# Patient Record
Sex: Female | Born: 1985 | Race: Black or African American | Hispanic: No | Marital: Single | Smoking: Current every day smoker
Health system: Southern US, Community
[De-identification: ages and names within clinical notes are randomized; demographics above are authoritative.]

## PROBLEM LIST (undated history)

## (undated) DIAGNOSIS — J45909 Unspecified asthma, uncomplicated: Secondary | ICD-10-CM

## (undated) DIAGNOSIS — F988 Other specified behavioral and emotional disorders with onset usually occurring in childhood and adolescence: Secondary | ICD-10-CM

## (undated) DIAGNOSIS — F4310 Post-traumatic stress disorder, unspecified: Secondary | ICD-10-CM

## (undated) DIAGNOSIS — F419 Anxiety disorder, unspecified: Secondary | ICD-10-CM

## (undated) DIAGNOSIS — F329 Major depressive disorder, single episode, unspecified: Secondary | ICD-10-CM

## (undated) DIAGNOSIS — D573 Sickle-cell trait: Secondary | ICD-10-CM

## (undated) DIAGNOSIS — F909 Attention-deficit hyperactivity disorder, unspecified type: Secondary | ICD-10-CM

## (undated) HISTORY — PX: APPENDECTOMY: SHX54

---

## 2016-02-02 ENCOUNTER — Emergency Department (HOSPITAL_COMMUNITY)
Admission: EM | Admit: 2016-02-02 | Discharge: 2016-02-02 | Disposition: A | Discharge status: Home / Self Care | Payer: Medicaid Other | Attending: Emergency Medicine | Admitting: Emergency Medicine

## 2016-02-02 ENCOUNTER — Emergency Department (HOSPITAL_COMMUNITY): Payer: Medicaid Other

## 2016-02-02 ENCOUNTER — Encounter (HOSPITAL_COMMUNITY): Payer: Self-pay | Admitting: Emergency Medicine

## 2016-02-02 DIAGNOSIS — J45909 Unspecified asthma, uncomplicated: Secondary | ICD-10-CM | POA: Diagnosis not present

## 2016-02-02 DIAGNOSIS — Z8659 Personal history of other mental and behavioral disorders: Secondary | ICD-10-CM | POA: Diagnosis not present

## 2016-02-02 DIAGNOSIS — F17200 Nicotine dependence, unspecified, uncomplicated: Secondary | ICD-10-CM | POA: Insufficient documentation

## 2016-02-02 DIAGNOSIS — Z862 Personal history of diseases of the blood and blood-forming organs and certain disorders involving the immune mechanism: Secondary | ICD-10-CM | POA: Diagnosis not present

## 2016-02-02 DIAGNOSIS — R05 Cough: Secondary | ICD-10-CM | POA: Diagnosis present

## 2016-02-02 DIAGNOSIS — J069 Acute upper respiratory infection, unspecified: Secondary | ICD-10-CM | POA: Diagnosis not present

## 2016-02-02 HISTORY — DX: Anxiety disorder, unspecified: F41.9

## 2016-02-02 HISTORY — DX: Major depressive disorder, single episode, unspecified: F32.9

## 2016-02-02 HISTORY — DX: Attention-deficit hyperactivity disorder, unspecified type: F90.9

## 2016-02-02 HISTORY — DX: Unspecified asthma, uncomplicated: J45.909

## 2016-02-02 HISTORY — DX: Post-traumatic stress disorder, unspecified: F43.10

## 2016-02-02 HISTORY — DX: Other specified behavioral and emotional disorders with onset usually occurring in childhood and adolescence: F98.8

## 2016-02-02 HISTORY — DX: Sickle-cell trait: D57.3

## 2016-02-02 LAB — RAPID STREP SCREEN (MED CTR MEBANE ONLY): STREPTOCOCCUS, GROUP A SCREEN (DIRECT): NEGATIVE

## 2016-02-02 MED ORDER — BENZONATATE 100 MG PO CAPS
100.0000 mg | ORAL_CAPSULE | Freq: Once | ORAL | Status: AC
  Administered 2016-02-02: 100 mg via ORAL
  Filled 2016-02-02: qty 1

## 2016-02-02 MED ORDER — IBUPROFEN 600 MG PO TABS: 600.0000 mg | ORAL_TABLET | Freq: Four times a day (QID) | ORAL | Status: DC | PRN

## 2016-02-02 MED ORDER — IBUPROFEN 200 MG PO TABS
600.0000 mg | ORAL_TABLET | Freq: Once | ORAL | Status: AC
  Administered 2016-02-02: 600 mg via ORAL
  Filled 2016-02-02: qty 3

## 2016-02-02 MED ORDER — BENZONATATE 100 MG PO CAPS: 100.0000 mg | ORAL_CAPSULE | Freq: Three times a day (TID) | ORAL | Status: DC | PRN

## 2016-02-02 NOTE — Discharge Instructions (Signed)
Upper Respiratory Infection, Adult Most upper respiratory infections (URIs) are a viral infection of the air passages leading to the lungs. A URI affects the nose, throat, and upper air passages. The most common type of URI is nasopharyngitis and is typically referred to as "the common cold." URIs run their course and usually go away on their own. Most of the time, a URI does not require medical attention, but sometimes a bacterial infection in the upper airways can follow a viral infection. This is called a secondary infection. Sinus and middle ear infections are common types of secondary upper respiratory infections. Bacterial pneumonia can also complicate a URI. A URI can worsen asthma and chronic obstructive pulmonary disease (COPD). Sometimes, these complications can require emergency medical care and may be life threatening.  CAUSES Almost all URIs are caused by viruses. A virus is a type of germ and can spread from one person to another.  RISKS FACTORS You may be at risk for a URI if:   You smoke.   You have chronic heart or lung disease.  You have a weakened defense (immune) system.   You are very young or very old.   You have nasal allergies or asthma.  You work in crowded or poorly ventilated areas.  You work in health care facilities or schools. SIGNS AND SYMPTOMS  Symptoms typically develop 2-3 days after you come in contact with a cold virus. Most viral URIs last 7-10 days. However, viral URIs from the influenza virus (flu virus) can last 14-18 days and are typically more severe. Symptoms may include:   Runny or stuffy (congested) nose.   Sneezing.   Cough.   Sore throat.   Headache.   Fatigue.   Fever.   Loss of appetite.   Pain in your forehead, behind your eyes, and over your cheekbones (sinus pain).  Muscle aches.  DIAGNOSIS  Your health care provider may diagnose a URI by:  Physical exam.  Tests to check that your symptoms are not due to  another condition such as:  Strep throat.  Sinusitis.  Pneumonia.  Asthma. TREATMENT  A URI goes away on its own with time. It cannot be cured with medicines, but medicines may be prescribed or recommended to relieve symptoms. Medicines may help:  Reduce your fever.  Reduce your cough.  Relieve nasal congestion. HOME CARE INSTRUCTIONS   Take medicines only as directed by your health care provider.   Gargle warm saltwater or take cough drops to comfort your throat as directed by your health care provider.  Use a warm mist humidifier or inhale steam from a shower to increase air moisture. This may make it easier to breathe.  Drink enough fluid to keep your urine clear or pale yellow.   Eat soups and other clear broths and maintain good nutrition.   Rest as needed.   Return to work when your temperature has returned to normal or as your health care provider advises. You may need to stay home longer to avoid infecting others. You can also use a face mask and careful hand washing to prevent spread of the virus.  Increase the usage of your inhaler if you have asthma.   Do not use any tobacco products, including cigarettes, chewing tobacco, or electronic cigarettes. If you need help quitting, ask your health care provider. PREVENTION  The best way to protect yourself from getting a cold is to practice good hygiene.   Avoid oral or hand contact with people with cold   symptoms.   Wash your hands often if contact occurs.  There is no clear evidence that vitamin C, vitamin E, echinacea, or exercise reduces the chance of developing a cold. However, it is always recommended to get plenty of rest, exercise, and practice good nutrition.  SEEK MEDICAL CARE IF:   You are getting worse rather than better.   Your symptoms are not controlled by medicine.   You have chills.  You have worsening shortness of breath.  You have brown or red mucus.  You have yellow or brown nasal  discharge.  You have pain in your face, especially when you bend forward.  You have a fever.  You have swollen neck glands.  You have pain while swallowing.  You have white areas in the back of your throat. SEEK IMMEDIATE MEDICAL CARE IF:   You have severe or persistent:  Headache.  Ear pain.  Sinus pain.  Chest pain.  You have chronic lung disease and any of the following:  Wheezing.  Prolonged cough.  Coughing up blood.  A change in your usual mucus.  You have a stiff neck.  You have changes in your:  Vision.  Hearing.  Thinking.  Mood. MAKE SURE YOU:   Understand these instructions.  Will watch your condition.  Will get help right away if you are not doing well or get worse.   This information is not intended to replace advice given to you by your health care provider. Make sure you discuss any questions you have with your health care provider.   Document Released: 05/04/2001 Document Revised: 03/25/2015 Document Reviewed: 02/13/2014 Elsevier Interactive Patient Education 2016 [employer]  

## 2016-02-02 NOTE — ED Notes (Signed)
Pt is in stable condition upon d/c and ambulates from ED. 

## 2016-02-02 NOTE — ED Provider Notes (Signed)
CSN: [id number]     Arrival date & time 02/02/16  [id number] History   First MD Initiated Contact with Patient 02/02/16 1119     Chief Complaint  Patient presents with  . Cough  . Sore Throat     (Consider location/radiation/quality/duration/timing/severity/associated sxs/prior Treatment) HPI Patient presents sore throat, nasal congestion, sinus congestion and cough for the past 4 days. She's had family members with similar symptoms. She denies any fever or chills. She's had no shortness of breath, headache, myalgias, neck pain or stiffness. She has had some chest pain that is worse with coughing. Denies any new lower extremity swelling or pain. No nausea, vomiting or diarrhea. Past Medical History  Diagnosis Date  . Asthma   . Sickle cell trait (HCC)   . PTSD (post-traumatic stress disorder)   . ADHD (attention deficit hyperactivity disorder)   . MDD (major depressive disorder) (HCC)   . ADD (attention deficit disorder)   . Anxiety    Past Surgical History  Procedure Laterality Date  . Appendectomy     No family history on file. Social History  Substance Use Topics  . Smoking status: Current Every Day Smoker  . Smokeless tobacco: None  . Alcohol Use: No   OB History    No data available     Review of Systems  Constitutional: Negative for fever and chills.  HENT: Positive for congestion, sinus pressure and sore throat.   Respiratory: Positive for cough. Negative for shortness of breath and wheezing.   Cardiovascular: Positive for chest pain. Negative for palpitations and leg swelling.  Gastrointestinal: Negative for nausea, vomiting, abdominal pain, diarrhea and constipation.  Musculoskeletal: Negative for myalgias, back pain, neck pain and neck stiffness.  Skin: Negative for rash and wound.  Neurological: Negative for dizziness, weakness, light-headedness, numbness and headaches.  All other systems reviewed and are negative.     Allergies  Review of patient's allergies  indicates no known allergies.  Home Medications   Prior to Admission medications   Medication Sig Start Date End Date Taking? Authorizing Provider  benzonatate (TESSALON) 100 MG capsule Take 1 capsule (100 mg total) by mouth 3 (three) times daily as needed for cough. 02/02/16   Loren Raceravid Terrace Fontanilla, MD  ibuprofen (ADVIL,MOTRIN) 600 MG tablet Take 1 tablet (600 mg total) by mouth every 6 (six) hours as needed for fever or moderate pain. 02/02/16   Loren Raceravid Fumiko Cham, MD   BP 114/50 mmHg  Pulse 60  Temp(Src) 98.1 F (36.7 C) (Oral)  Resp 20  Ht 5\' 6"  (1.676 m)  Wt 131 lb 2 oz (59.478 kg)  BMI 21.17 kg/m2  SpO2 100%  LMP 01/20/2016 Physical Exam  Constitutional: She is oriented to person, place, and time. She appears well-developed and well-nourished. No distress.  HENT:  Head: Normocephalic and atraumatic.  Mouth/Throat: Oropharynx is clear and moist. No oropharyngeal exudate.  Uvula is midline. No tonsillar exudates. No sinus tenderness with percussion.  Eyes: EOM are normal. Pupils are equal, round, and reactive to light.  Neck: Normal range of motion. Neck supple.  No meningismus  Cardiovascular: Normal rate and regular rhythm.   Pulmonary/Chest: Effort normal and breath sounds normal. No respiratory distress. She has no wheezes. She has no rales. She exhibits tenderness (chest tenderness with palpation over the bilateral upper chest. No crepitus or deformity.).  Abdominal: Soft. Bowel sounds are normal. She exhibits no distension and no mass. There is no tenderness. There is no rebound and no guarding.  Musculoskeletal: Normal range of motion.  She exhibits no edema or tenderness.  Lymphadenopathy:    She has no cervical adenopathy.  Neurological: She is alert and oriented to person, place, and time.  Patient is alert and oriented x3 with clear, goal oriented speech. Patient has 5/5 motor in all extremities. Sensation is intact to light touch. Patient has a normal gait and walks without  assistance.  Skin: Skin is warm and dry. No rash noted. No erythema.  Psychiatric: She has a normal mood and affect. Her behavior is normal.  Nursing note and vitals reviewed.   ED Course  Procedures (including critical care time) Labs Review Labs Reviewed  RAPID STREP SCREEN (NOT AT [facility])  CULTURE, GROUP A STREP [facility])    Imaging Review Dg Chest 2 View  02/02/2016  CLINICAL DATA:  Productive cough with chest pain beginning 3 days ago, history asthma, sickle cell trait, smoker EXAM: CHEST  2 VIEW COMPARISON:  None FINDINGS: Normal heart size, mediastinal contours, and pulmonary vascularity. Lungs clear. No pneumothorax. Bones unremarkable. IMPRESSION: Normal exam. Electronically Signed   By: Ulyses Southward M.D.   On: 02/02/2016 12:32   I have personally reviewed and evaluated these images and lab results as part of my medical decision-making.   EKG Interpretation None      MDM   Final diagnoses:  URI (upper respiratory infection)    Patient very well-appearing. Symptoms consistent with URI. Strep is negative as well as chest x-ray for any acute findings. We'll treat symptomatically. Chest pain is reproducible and worse with cough. Consistent with chest wall pain versus pleurisy. Will start on anti-inflammatory medication. Return precautions have been given.    Loren Racer, MD 02/02/16 1245

## 2016-02-02 NOTE — ED Notes (Signed)
Pt c/o cough and sore throat onset Friday. Pt reports pain in chest when she coughs. Pt reports family has been recently sick.

## 2016-02-04 LAB — CULTURE, GROUP A STREP (THRC)

## 2016-03-30 ENCOUNTER — Emergency Department (HOSPITAL_COMMUNITY)
Admission: EM | Admit: 2016-03-30 | Discharge: 2016-03-30 | Disposition: A | Discharge status: Home / Self Care | Payer: Self-pay

## 2016-03-30 NOTE — ED Notes (Signed)
Pt called for triage all areas lobby - no response.

## 2016-03-30 NOTE — ED Notes (Signed)
Pt called x 3 all areas of lobby. Not in lobby.

## 2016-04-03 ENCOUNTER — Encounter (HOSPITAL_COMMUNITY): Payer: Self-pay | Admitting: Emergency Medicine

## 2016-04-03 ENCOUNTER — Emergency Department (HOSPITAL_COMMUNITY)
Admission: EM | Admit: 2016-04-03 | Discharge: 2016-04-03 | Disposition: A | Discharge status: Home / Self Care | Payer: Medicaid Other | Attending: Emergency Medicine | Admitting: Emergency Medicine

## 2016-04-03 DIAGNOSIS — H10213 Acute toxic conjunctivitis, bilateral: Secondary | ICD-10-CM | POA: Insufficient documentation

## 2016-04-03 DIAGNOSIS — F329 Major depressive disorder, single episode, unspecified: Secondary | ICD-10-CM | POA: Diagnosis not present

## 2016-04-03 DIAGNOSIS — Y998 Other external cause status: Secondary | ICD-10-CM | POA: Insufficient documentation

## 2016-04-03 DIAGNOSIS — Z862 Personal history of diseases of the blood and blood-forming organs and certain disorders involving the immune mechanism: Secondary | ICD-10-CM | POA: Diagnosis not present

## 2016-04-03 DIAGNOSIS — T520X1A Toxic effect of petroleum products, accidental (unintentional), initial encounter: Secondary | ICD-10-CM | POA: Insufficient documentation

## 2016-04-03 DIAGNOSIS — Z79899 Other long term (current) drug therapy: Secondary | ICD-10-CM | POA: Diagnosis not present

## 2016-04-03 DIAGNOSIS — Y9389 Activity, other specified: Secondary | ICD-10-CM | POA: Insufficient documentation

## 2016-04-03 DIAGNOSIS — J45909 Unspecified asthma, uncomplicated: Secondary | ICD-10-CM | POA: Insufficient documentation

## 2016-04-03 DIAGNOSIS — F4310 Post-traumatic stress disorder, unspecified: Secondary | ICD-10-CM | POA: Diagnosis not present

## 2016-04-03 DIAGNOSIS — F419 Anxiety disorder, unspecified: Secondary | ICD-10-CM | POA: Insufficient documentation

## 2016-04-03 DIAGNOSIS — S0591XA Unspecified injury of right eye and orbit, initial encounter: Secondary | ICD-10-CM | POA: Diagnosis present

## 2016-04-03 DIAGNOSIS — X58XXXA Exposure to other specified factors, initial encounter: Secondary | ICD-10-CM | POA: Diagnosis not present

## 2016-04-03 DIAGNOSIS — F17200 Nicotine dependence, unspecified, uncomplicated: Secondary | ICD-10-CM | POA: Insufficient documentation

## 2016-04-03 DIAGNOSIS — Y9289 Other specified places as the place of occurrence of the external cause: Secondary | ICD-10-CM | POA: Diagnosis not present

## 2016-04-03 MED ORDER — TETRACAINE HCL 0.5 % OP SOLN
2.0000 [drp] | Freq: Once | OPHTHALMIC | Status: AC
  Administered 2016-04-03: 2 [drp] via OPHTHALMIC
  Filled 2016-04-03: qty 4

## 2016-04-03 NOTE — Discharge Instructions (Signed)
Use artificial tears as needed to both eyes. Follow-up if any problem

## 2016-04-03 NOTE — ED Notes (Signed)
Her eyes are normal in appearance and she tells me her vision is normal.  She is still phoning to obtain a ride home-will d/c to lobby shortly.
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
## 2016-06-08 ENCOUNTER — Encounter (HOSPITAL_COMMUNITY): Payer: Self-pay | Admitting: Emergency Medicine

## 2016-06-08 ENCOUNTER — Emergency Department (HOSPITAL_COMMUNITY)
Admission: EM | Admit: 2016-06-08 | Discharge: 2016-06-08 | Disposition: A | Discharge status: Home / Self Care | Payer: Medicaid Other | Attending: Emergency Medicine | Admitting: Emergency Medicine

## 2016-06-08 ENCOUNTER — Emergency Department (HOSPITAL_COMMUNITY): Payer: Medicaid Other

## 2016-06-08 DIAGNOSIS — F329 Major depressive disorder, single episode, unspecified: Secondary | ICD-10-CM | POA: Diagnosis not present

## 2016-06-08 DIAGNOSIS — T796XXA Traumatic ischemia of muscle, initial encounter: Secondary | ICD-10-CM | POA: Insufficient documentation

## 2016-06-08 DIAGNOSIS — J45909 Unspecified asthma, uncomplicated: Secondary | ICD-10-CM | POA: Insufficient documentation

## 2016-06-08 DIAGNOSIS — M25561 Pain in right knee: Secondary | ICD-10-CM | POA: Diagnosis present

## 2016-06-08 DIAGNOSIS — F17200 Nicotine dependence, unspecified, uncomplicated: Secondary | ICD-10-CM | POA: Insufficient documentation

## 2016-06-08 DIAGNOSIS — S86892A Other injury of other muscle(s) and tendon(s) at lower leg level, left leg, initial encounter: Secondary | ICD-10-CM

## 2016-06-08 MED ORDER — IBUPROFEN 600 MG PO TABS: 600.0000 mg | ORAL_TABLET | Freq: Four times a day (QID) | ORAL | Status: DC | PRN
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
## 2016-06-08 NOTE — Discharge Instructions (Signed)
Apply ice to affected area as needed for pain relief. Ibuprofen can be used as well for pain relief as needed. Rest and limit exercise / walking up and down stairs as much as possible for the next week.  Follow up with orthopedics if symptoms do not improve in 2 weeks or if symptoms worsen.  Return to ER for new or worsening symptoms, any additional concerns.
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
## 2016-09-04 ENCOUNTER — Encounter (HOSPITAL_COMMUNITY): Payer: Self-pay | Admitting: *Deleted

## 2016-09-04 ENCOUNTER — Emergency Department (HOSPITAL_COMMUNITY)
Admission: EM | Admit: 2016-09-04 | Discharge: 2016-09-06 | Disposition: A | Discharge status: Other Acute Inpatient Hospital | Payer: Medicaid Other | Attending: Emergency Medicine | Admitting: Emergency Medicine

## 2016-09-04 DIAGNOSIS — R451 Restlessness and agitation: Secondary | ICD-10-CM | POA: Diagnosis not present

## 2016-09-04 DIAGNOSIS — R45851 Suicidal ideations: Secondary | ICD-10-CM

## 2016-09-04 DIAGNOSIS — J45909 Unspecified asthma, uncomplicated: Secondary | ICD-10-CM | POA: Diagnosis not present

## 2016-09-04 DIAGNOSIS — Z5181 Encounter for therapeutic drug level monitoring: Secondary | ICD-10-CM | POA: Diagnosis not present

## 2016-09-04 DIAGNOSIS — F909 Attention-deficit hyperactivity disorder, unspecified type: Secondary | ICD-10-CM | POA: Diagnosis not present

## 2016-09-04 DIAGNOSIS — F17200 Nicotine dependence, unspecified, uncomplicated: Secondary | ICD-10-CM | POA: Diagnosis not present

## 2016-09-04 DIAGNOSIS — A599 Trichomoniasis, unspecified: Secondary | ICD-10-CM

## 2016-09-04 LAB — URINALYSIS, ROUTINE W REFLEX MICROSCOPIC
Bilirubin Urine: NEGATIVE
Glucose, UA: 250 mg/dL — AB
Ketones, ur: NEGATIVE mg/dL
Nitrite: NEGATIVE
PROTEIN: NEGATIVE mg/dL
Specific Gravity, Urine: 1.01 (ref 1.005–1.030)
pH: 5.5 (ref 5.0–8.0)

## 2016-09-04 LAB — CBC WITH DIFFERENTIAL/PLATELET
Basophils Absolute: 0 10*3/uL (ref 0.0–0.1)
Basophils Relative: 0 %
EOS PCT: 0 %
Eosinophils Absolute: 0 10*3/uL (ref 0.0–0.7)
HCT: 36 % (ref 36.0–46.0)
Hemoglobin: 12.1 g/dL (ref 12.0–15.0)
LYMPHS ABS: 1.2 10*3/uL (ref 0.7–4.0)
LYMPHS PCT: 8 %
MCH: 29.1 pg (ref 26.0–34.0)
MCHC: 33.6 g/dL (ref 30.0–36.0)
MCV: 86.5 fL (ref 78.0–100.0)
MONO ABS: 1 10*3/uL (ref 0.1–1.0)
Monocytes Relative: 7 %
Neutro Abs: 12.4 10*3/uL — ABNORMAL HIGH (ref 1.7–7.7)
Neutrophils Relative %: 85 %
PLATELETS: 195 10*3/uL (ref 150–400)
RBC: 4.16 MIL/uL (ref 3.87–5.11)
RDW: 14.3 % (ref 11.5–15.5)
WBC: 14.6 10*3/uL — ABNORMAL HIGH (ref 4.0–10.5)

## 2016-09-04 LAB — URINE MICROSCOPIC-ADD ON

## 2016-09-04 LAB — BASIC METABOLIC PANEL
Anion gap: 7 (ref 5–15)
BUN: 15 mg/dL (ref 6–20)
CALCIUM: 9.4 mg/dL (ref 8.9–10.3)
CO2: 20 mmol/L — ABNORMAL LOW (ref 22–32)
Chloride: 111 mmol/L (ref 101–111)
Creatinine, Ser: 0.99 mg/dL (ref 0.44–1.00)
GFR calc Af Amer: >60 mL/min (ref >60)
GLUCOSE: 77 mg/dL (ref 65–99)
POTASSIUM: 3.8 mmol/L (ref 3.5–5.1)
Sodium: 138 mmol/L (ref 135–145)

## 2016-09-04 LAB — CBG MONITORING, ED
Glucose-Capillary: 77 mg/dL (ref 65–99)
Glucose-Capillary: 62 mg/dL — ABNORMAL LOW (ref 65–99)

## 2016-09-04 LAB — ETHANOL

## 2016-09-04 LAB — RAPID URINE DRUG SCREEN, HOSP PERFORMED
AMPHETAMINES: NOT DETECTED
BARBITURATES: NOT DETECTED
Benzodiazepines: NOT DETECTED
Cocaine: NOT DETECTED
Opiates: NOT DETECTED
Tetrahydrocannabinol: POSITIVE — AB

## 2016-09-04 LAB — PREGNANCY, URINE: PREG TEST UR: NEGATIVE

## 2016-09-04 MED ORDER — ONDANSETRON HCL 4 MG PO TABS: 4.0000 mg | ORAL_TABLET | Freq: Three times a day (TID) | ORAL | Status: DC | PRN

## 2016-09-04 MED ORDER — SODIUM CHLORIDE 0.9 % IV BOLUS (SEPSIS)
1000.0000 mL | Freq: Once | INTRAVENOUS | Status: AC
  Administered 2016-09-04: 1000 mL via INTRAVENOUS

## 2016-09-04 MED ORDER — LORAZEPAM 2 MG/ML IJ SOLN
2.0000 mg | Freq: Once | INTRAMUSCULAR | Status: AC
  Administered 2016-09-04: 2 mg via INTRAMUSCULAR

## 2016-09-04 MED ORDER — DEXTROSE 50 % IV SOLN
1.0000 | Freq: Once | INTRAVENOUS | Status: AC
  Administered 2016-09-04: 50 mL via INTRAVENOUS
  Filled 2016-09-04: qty 50

## 2016-09-04 MED ORDER — ALUM & MAG HYDROXIDE-SIMETH 200-200-20 MG/5ML PO SUSP: 30.0000 mL | ORAL | Status: DC | PRN

## 2016-09-04 MED ORDER — MIRTAZAPINE 30 MG PO TABS
15.0000 mg | ORAL_TABLET | Freq: Every day | ORAL | Status: DC
  Administered 2016-09-05: 15 mg via ORAL
  Filled 2016-09-04: qty 1

## 2016-09-04 MED ORDER — LORAZEPAM 1 MG PO TABS
1.0000 mg | ORAL_TABLET | Freq: Three times a day (TID) | ORAL | Status: DC | PRN
  Administered 2016-09-05: 1 mg via ORAL
  Filled 2016-09-04: qty 1

## 2016-09-04 MED ORDER — IBUPROFEN 200 MG PO TABS: 600.0000 mg | ORAL_TABLET | Freq: Three times a day (TID) | ORAL | Status: DC | PRN

## 2016-09-04 MED ORDER — ZIPRASIDONE MESYLATE 20 MG IM SOLR
15.0000 mg | Freq: Once | INTRAMUSCULAR | Status: AC
  Administered 2016-09-04: 15 mg via INTRAMUSCULAR

## 2016-09-04 MED ORDER — STERILE WATER FOR INJECTION IJ SOLN
1.9000 mL | Freq: Once | INTRAMUSCULAR | Status: AC
  Administered 2016-09-04: 1.9 mL via INTRAMUSCULAR

## 2016-09-04 MED ORDER — ACETAMINOPHEN 325 MG PO TABS
650.0000 mg | ORAL_TABLET | ORAL | Status: DC | PRN
  Filled 2016-09-04: qty 2
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
## 2016-09-04 NOTE — ED Notes (Signed)
Bed: WA09 Expected date:  Expected time:  Means of arrival:  Comments: 

## 2016-09-04 NOTE — ED Notes (Signed)
Pt's contact:  Marchelle Folksmanda (pt's fiancee/girlfriend)--- tel# [phone]

## 2016-09-04 NOTE — ED Notes (Signed)
Dr.Haviland at bedside to discuss with pt plan of care.

## 2016-09-04 NOTE — ED Notes (Signed)
Unable to collect labs at this time because of patient behavior. 

## 2016-09-04 NOTE — ED Notes (Signed)
Bed: RESB Expected date:  Expected time:  Means of arrival:  Comments: Med clearance 
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
## 2016-09-04 NOTE — ED Notes (Addendum)
Patient's girlfriend here in triage asking to go back to see patient.  Susann Givensmanda Tarpley is gf's name. She has been unable to contact patient's mother or grandmother because their phones are disconnected.  Ms. Gretchen Shortarpley states patient has a hx of ADHD, ODD, PTSD and depression and is on disability for same.  GF states patient does not drink or use drugs "that [she] knows of."  GF states patient takes medication for ADHD, but she is unaware of name of medication.  Patient [facility] over the last month and when she went back the 3rd time she was told she "was fine," and didn't need to come back.  GF is uncertain as to what medication they gave her, but [facility]

## 2016-09-04 NOTE — ED Provider Notes (Signed)
Pt is now awake, alert and calm.  She is medically clear.   Jacalyn LefevreJulie Kellar Westberg, MD 09/04/16 [phone]

## 2016-09-04 NOTE — ED Notes (Signed)
Pt currently awake and oriented. Pt currently denies any SI. Pt informed that she is under involuntary commitment at this time due to SI. Pt stating that she wants to go home tonight. Sitter at bedside with pt at this time. Awaiting clearance for TTS.

## 2016-09-04 NOTE — ED Notes (Signed)
Patient changed into purple scrubs and belongings at nurses station. TTS at bedside. Patient currently verbally agitated.

## 2016-09-05 MED ORDER — METRONIDAZOLE 500 MG PO TABS
2000.0000 mg | ORAL_TABLET | Freq: Once | ORAL | Status: AC
Start: 2016-09-06 — End: 2016-09-05
  Administered 2016-09-05: 2000 mg via ORAL
  Filled 2016-09-05: qty 4

## 2016-09-05 NOTE — ED Notes (Signed)
Calmer, answering verbally.

## 2016-09-05 NOTE — ED Notes (Addendum)
Patient belonging locked in locker 32

## 2016-09-05 NOTE — ED Notes (Signed)
Patient belonging locked in locker 32 

## 2016-09-05 NOTE — ED Notes (Signed)
Bed: [facility]36 Expected date:  Expected time:  Means of arrival:  Comments: Room 32 

## 2016-09-05 NOTE — ED Notes (Signed)
Pt up in the hall but returned to room when approached, no verbal response to question

## 2016-09-05 NOTE — ED Notes (Signed)
Pt returned to bed quietly. Security remains at bedside.

## 2016-09-05 NOTE — ED Notes (Signed)
Patient is resting comfortably. Sitter remains at bedside.

## 2016-09-05 NOTE — ED Notes (Signed)
Report to include situation, background, assessment and recommendations from Lizbeth BarkJanie Rambo RN. Patient on phone, respirations regular and unlabored. Q15 minute rounds and security camera observation to continue.

## 2016-09-05 NOTE — ED Notes (Signed)
Pt ambulatory w/o difficulty to room 36 

## 2016-09-05 NOTE — ED Notes (Signed)
Patient noted sleeping in room. No complaints, stable, in no acute distress. Q15 minute rounds and monitoring via Security Cameras to continue.  

## 2016-09-05 NOTE — ED Notes (Addendum)
Pt inquired when breakfast would be here. Pt is pleasant and cooperative.Pt appears to have a blunt affect. Pt has no eye contact and is very depressed. When asked if something happened to her she shook her head yes. Phoned dietary as pt can not eat bacon. Waiting for a breakfast tray. (9am)Pt was standing in the BR with both arms crossed and cryiing. She denies any auditory hallucinations. Pt asked to use the phone at 9:40am. Pt spoke to her aunt who is an inpt upstairs. Pt given 1mg  of ativan at 10a for hysterical crying.She at times appears paranoid and unpredictable in her behavior.

## 2016-09-05 NOTE — BH Assessment (Signed)
[facility] Assessment Progress Note  09/05/16: Patient continues to meet inpatient criteria as appropriate bed placement is investigated.     

## 2016-09-05 NOTE — ED Notes (Signed)
On the phone 

## 2016-09-05 NOTE — ED Notes (Signed)
Snack given.

## 2016-09-05 NOTE — [facility] Counselor (Addendum)
Clinician informed Jillyn HiddenGary, RN, [facility] [place] Attending provider is Dr. Jama Flavorsobos. Pt can came now.   Gwinda Passereylese D Bennett, MS, [facility], [facility] Triage Specialist [phone]

## 2016-09-05 NOTE — ED Notes (Signed)
Pt up standing in the hall

## 2016-09-05 NOTE — ED Notes (Signed)
Report on pt given to TCU RN. 

## 2016-09-05 NOTE — ED Notes (Signed)
Pt attempted to make a few phone calls. No answer. Pt quietly walked back to room. Sitter remains at bedside.

## 2016-09-05 NOTE — ED Notes (Signed)
Patient noted in hall. No complaints, stable, in no acute distress. Q15 minute rounds and monitoring via Security Cameras to continue. 

## 2016-09-05 NOTE — ED Notes (Signed)
After talking with significant other on phone. Pt became upset as there was some discussion of breaking up, pt walked back to room and sat on bathroom floor and began to cry. Pt attempted to close bathroom door and was instructed not to do so by staff due to safety reasons. Pt began to scream out. Security reported to bedside. pts significant other called back to say she loved her "fiance and she was not breaking up with her." Pt notified about conversation and that she would be allowed to call her significant other when she called down and staff is attempting to move patient to a calmer/more private environment for patient.

## 2016-09-06 ENCOUNTER — Encounter (HOSPITAL_COMMUNITY): Payer: Self-pay

## 2016-09-06 ENCOUNTER — Inpatient Hospital Stay (HOSPITAL_COMMUNITY)
Admission: AD | Admit: 2016-09-06 | Discharge: 2016-09-08 | DRG: 885 | Disposition: A | Discharge status: Home / Self Care | Payer: Medicaid Other | Source: Intra-hospital | Attending: Psychiatry | Admitting: Psychiatry

## 2016-09-06 DIAGNOSIS — D573 Sickle-cell trait: Secondary | ICD-10-CM | POA: Diagnosis present

## 2016-09-06 DIAGNOSIS — F17200 Nicotine dependence, unspecified, uncomplicated: Secondary | ICD-10-CM | POA: Diagnosis present

## 2016-09-06 DIAGNOSIS — F411 Generalized anxiety disorder: Secondary | ICD-10-CM | POA: Diagnosis present

## 2016-09-06 DIAGNOSIS — F339 Major depressive disorder, recurrent, unspecified: Secondary | ICD-10-CM | POA: Diagnosis present

## 2016-09-06 DIAGNOSIS — F4310 Post-traumatic stress disorder, unspecified: Secondary | ICD-10-CM | POA: Diagnosis present

## 2016-09-06 DIAGNOSIS — Z79899 Other long term (current) drug therapy: Secondary | ICD-10-CM

## 2016-09-06 DIAGNOSIS — F909 Attention-deficit hyperactivity disorder, unspecified type: Secondary | ICD-10-CM | POA: Diagnosis present

## 2016-09-06 DIAGNOSIS — G4700 Insomnia, unspecified: Secondary | ICD-10-CM | POA: Diagnosis present

## 2016-09-06 DIAGNOSIS — R8290 Unspecified abnormal findings in urine: Secondary | ICD-10-CM

## 2016-09-06 DIAGNOSIS — F4000 Agoraphobia, unspecified: Secondary | ICD-10-CM | POA: Diagnosis present

## 2016-09-06 DIAGNOSIS — R45851 Suicidal ideations: Secondary | ICD-10-CM | POA: Diagnosis present

## 2016-09-06 DIAGNOSIS — Z818 Family history of other mental and behavioral disorders: Secondary | ICD-10-CM | POA: Diagnosis not present

## 2016-09-06 DIAGNOSIS — F331 Major depressive disorder, recurrent, moderate: Secondary | ICD-10-CM | POA: Diagnosis not present

## 2016-09-06 DIAGNOSIS — F17210 Nicotine dependence, cigarettes, uncomplicated: Secondary | ICD-10-CM | POA: Diagnosis not present

## 2016-09-06 DIAGNOSIS — F332 Major depressive disorder, recurrent severe without psychotic features: Secondary | ICD-10-CM | POA: Diagnosis not present

## 2016-09-06 MED ORDER — TRAZODONE HCL 50 MG PO TABS
50.0000 mg | ORAL_TABLET | Freq: Every evening | ORAL | Status: DC | PRN
  Filled 2016-09-06 (×2): qty 1

## 2016-09-06 MED ORDER — HYDROXYZINE HCL 25 MG PO TABS
25.0000 mg | ORAL_TABLET | Freq: Three times a day (TID) | ORAL | Status: DC | PRN
  Filled 2016-09-06: qty 10

## 2016-09-06 MED ORDER — MIRTAZAPINE 15 MG PO TABS
15.0000 mg | ORAL_TABLET | Freq: Every day | ORAL | Status: DC
  Administered 2016-09-06 – 2016-09-07 (×2): 15 mg via ORAL
  Filled 2016-09-06: qty 1
  Filled 2016-09-06: qty 7
  Filled 2016-09-06 (×2): qty 1

## 2016-09-06 MED ORDER — ALUM & MAG HYDROXIDE-SIMETH 200-200-20 MG/5ML PO SUSP: 30.0000 mL | ORAL | Status: DC | PRN

## 2016-09-06 MED ORDER — ACETAMINOPHEN 325 MG PO TABS: 650.0000 mg | ORAL_TABLET | Freq: Four times a day (QID) | ORAL | Status: DC | PRN

## 2016-09-06 MED ORDER — TRAZODONE HCL 50 MG PO TABS
50.0000 mg | ORAL_TABLET | Freq: Every evening | ORAL | Status: DC | PRN
  Filled 2016-09-06: qty 7

## 2016-09-06 MED ORDER — MAGNESIUM HYDROXIDE 400 MG/5ML PO SUSP: 30.0000 mL | Freq: Every day | ORAL | Status: DC | PRN
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
## 2016-09-06 NOTE — [facility] Group Notes (Signed)
[facility] LCSW Group Therapy  09/06/2016 1:15pm  Type of Therapy:  Group Therapy vercoming Obstacles  Pt did not attend, declined invitation.    Chad CordialLauren Carter, LCSWA 09/06/2016 4:22 PM

## 2016-09-06 NOTE — Progress Notes (Signed)
Admission Note  Pt is 30 y/o AA female admitted onto the 400 I/P. Pt at the time of admission denies any form of depression, anxiety, pain, SI, HI or AVH; States, "I have nothing going on with me; I don't understand." Pt is flat and looks very irritable; made minimal interaction; reluctantly answers "yes" or "no" questions. Support, encouragement, and safe environment provided.  15-minute safety checks initiated and continued.

## 2016-09-06 NOTE — Progress Notes (Signed)
Specimen cup given to pt for UA.

## 2016-09-06 NOTE — Tx Team (Signed)
Interdisciplinary Treatment and Diagnostic Plan Update  09/06/2016 Time of Session: 4:25 PM  Amber Bernard MRN: [id number]  Principal Diagnosis: Major depression, recurrent ([place])  Secondary Diagnoses: Principal Problem:   Major depression, recurrent ([place])   Current Medications:  Current Facility-Administered Medications  Medication Dose Route Frequency Provider Last Rate Last Dose  . acetaminophen (TYLENOL) tablet 650 mg  650 mg Oral Q6H PRN Rozetta Nunnery, NP      . alum & mag hydroxide-simeth (MAALOX/MYLANTA) 200-200-20 MG/5ML suspension 30 mL  30 mL Oral Q4H PRN Rozetta Nunnery, NP      . hydrOXYzine (ATARAX/VISTARIL) tablet 25 mg  25 mg Oral TID PRN Rozetta Nunnery, NP      . magnesium hydroxide (MILK OF MAGNESIA) suspension 30 mL  30 mL Oral Daily PRN Rozetta Nunnery, NP      . mirtazapine (REMERON) tablet 15 mg  15 mg Oral QHS Rozetta Nunnery, NP      . traZODone (DESYREL) tablet 50 mg  50 mg Oral QHS PRN Jenne Campus, MD        PTA Medications: Prescriptions Prior to Admission  Medication Sig Dispense Refill Last Dose  . mirtazapine (REMERON) 15 MG tablet Take 15 mg by mouth at bedtime.   09/06/2016 at Unknown time    Treatment Modalities: Medication Management, Group therapy, Case management,  1 to 1 session with clinician, Psychoeducation, Recreational therapy.  Patient Stressors: Marital or family conflict Medication change or noncompliance Substance abuse  Patient Strengths: Capable of independent living Armed forces logistics/support/administrative officer Physical Health Supportive family/friends  Physician Treatment Plan for Primary Diagnosis: Major depression, recurrent ([place]) Long Term Goal(s): Improvement in symptoms so as ready for discharge  Short Term Goals: Ability to verbalize feelings will improve Ability to disclose and discuss suicidal ideas Ability to identify and develop effective coping behaviors will improve Ability to verbalize feelings will improve Ability to disclose and  discuss suicidal ideas Ability to identify and develop effective coping behaviors will improve  Medication Management: Evaluate patient's response, side effects, and tolerance of medication regimen.  Therapeutic Interventions: 1 to 1 sessions, Unit Group sessions and Medication administration.  Evaluation of Outcomes: Not Met  Physician Treatment Plan for Secondary Diagnosis: Principal Problem:   Major depression, recurrent ([place])   Long Term Goal(s): Improvement in symptoms so as ready for discharge  Short Term Goals: Ability to verbalize feelings will improve Ability to disclose and discuss suicidal ideas Ability to identify and develop effective coping behaviors will improve Ability to verbalize feelings will improve Ability to disclose and discuss suicidal ideas Ability to identify and develop effective coping behaviors will improve  Medication Management: Evaluate patient's response, side effects, and tolerance of medication regimen.  Therapeutic Interventions: 1 to 1 sessions, Unit Group sessions and Medication administration.  Evaluation of Outcomes: Not Met   RN Treatment Plan for Primary Diagnosis: Major depression, recurrent ([place]) Long Term Goal(s): Knowledge of disease and therapeutic regimen to maintain health will improve  Short Term Goals: Ability to verbalize feelings will improve, Ability to disclose and discuss suicidal ideas and Ability to identify and develop effective coping behaviors will improve  Medication Management: RN will administer medications as ordered by provider, will assess and evaluate patient's response and provide education to patient for prescribed medication. RN will report any adverse and/or side effects to prescribing provider.  Therapeutic Interventions: 1 on 1 counseling sessions, Psychoeducation, Medication administration, Evaluate responses to treatment, Monitor vital signs and CBGs as ordered, Perform/monitor CIWA, COWS,  AIMS and Fall  Risk screenings as ordered, Perform wound care treatments as ordered.  Evaluation of Outcomes: Not Met   LCSW Treatment Plan for Primary Diagnosis: Major depression, recurrent ([place]) Long Term Goal(s): Safe transition to appropriate next level of care at discharge, Engage patient in therapeutic group addressing interpersonal concerns.  Short Term Goals: Engage patient in aftercare planning with referrals and resources, Increase emotional regulation, Facilitate acceptance of mental health diagnosis and concerns and Increase skills for wellness and recovery  Therapeutic Interventions: Assess for all discharge needs, 1 to 1 time with Social worker, Explore available resources and support systems, Assess for adequacy in community support network, Educate family and significant other(s) on suicide prevention, Complete Psychosocial Assessment, Interpersonal group therapy.  Evaluation of Outcomes: Not Met   Progress in Treatment: Attending groups: Pt is new to milieu, continuing to assess  Participating in groups: Pt is new to milieu, continuing to assess  Taking medication as prescribed: Yes, MD continues to assess for medication changes as needed Toleration medication: Yes, no side effects reported at this time Family/Significant other contact made: No, Pt declines Patient understands diagnosis: Continuing to assess Discussing patient identified problems/goals with staff: Yes Medical problems stabilized or resolved: Yes Denies suicidal/homicidal ideation: Yes Issues/concerns per patient self-inventory: None Other: N/A  New problem(s) identified: None identified at this time.   New Short Term/Long Term Goal(s): None identified at this time.   Discharge Plan or Barriers: Pt will return home and follow-up with outpatient services.   Reason for Continuation of Hospitalization: Anxiety Depression Medication stabilization Suicidal ideation  Estimated Length of Stay: 1-3  days  Attendees: Patient: 09/06/2016  4:25 PM  Physician: Dr. Parke Poisson 09/06/2016  4:25 PM  Nursing: Pat Kocher 09/06/2016  4:25 PM  RN Care Manager: Lars Pinks, RN 09/06/2016  4:25 PM  Social Worker: Peri Maris, LCSW; Kristin Drinkard, LCSW 09/06/2016  4:25 PM  Recreational Therapist:  09/06/2016  4:25 PM  Other: Fransico Him; NP 09/06/2016  4:25 PM  Other:  09/06/2016  4:25 PM  Other: 09/06/2016  4:25 PM    Scribe for Treatment Team: Bo Mcclintock, LCSW 09/06/2016 4:25 PM
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
## 2016-09-06 NOTE — Progress Notes (Signed)
U/A specimen collected.

## 2016-09-06 NOTE — Progress Notes (Signed)
Adult Psychoeducational Group Note  Date:  09/06/2016 Time:  11:35 PM  Group Topic/Focus:  Wrap-Up Group:   The focus of this group is to help patients review their daily goal of treatment and discuss progress on daily workbooks.   Participation Level:  Active  Participation Quality:  Appropriate  Affect:  Appropriate  Cognitive:  Alert  Insight: Appropriate  Engagement in Group:  Engaged  Modes of Intervention:  Discussion  Additional Comments:  On a scale between 1-10, (1=worse, 10=best) patient rated her day a 7.  Amber Bernard L Amber Bernard 09/06/2016, 11:35 PM
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
## 2016-09-06 NOTE — [facility] Suicide Risk Assessment (Signed)
[facility] INPATIENT:  Family/Significant Other Suicide Prevention Education  Suicide Prevention Education:  Patient Refusal for Family/Significant Other Suicide Prevention Education: The patient Amber Bernard has refused to provide written consent for family/significant other to be provided Family/Significant Other Suicide Prevention Education during admission and/or prior to discharge.  Physician notified.  Elaina HoopsLauren M Carter 09/06/2016, 4:27 PM
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
## 2016-09-06 NOTE — Tx Team (Signed)
Initial Treatment Plan 09/06/2016 3:07 AM Amber Ricketts FAO:[id number]    PATIENT STRESSORS: Marital or family conflict Medication change or noncompliance Substance abuse   PATIENT STRENGTHS: Capable of independent living Manufacturing systems engineerCommunication skills Physical Health Supportive family/friends   PATIENT IDENTIFIED PROBLEMS: "I have nothing to work on"  Risk for Suicide  Depression  Substance Abuse               DISCHARGE CRITERIA:  Ability to meet basic life and health needs Reduction of life-threatening or endangering symptoms to within safe limits Verbal commitment to aftercare and medication compliance  PRELIMINARY DISCHARGE PLAN: Outpatient therapy Return to previous living arrangement  PATIENT/FAMILY INVOLVEMENT: This treatment plan has been presented to and reviewed with the patient, Amber AlfRoxanna Bernard, and/or family member.  The patient and family have been given the opportunity to ask questions and make suggestions.  [name] T Female Iafrate, RN 09/06/2016, 3:07 AM

## 2016-09-07 DIAGNOSIS — F332 Major depressive disorder, recurrent severe without psychotic features: Secondary | ICD-10-CM

## 2016-09-07 DIAGNOSIS — R8290 Unspecified abnormal findings in urine: Secondary | ICD-10-CM

## 2016-09-07 LAB — URINALYSIS W MICROSCOPIC (NOT AT ARMC)
BILIRUBIN URINE: NEGATIVE
Glucose, UA: NEGATIVE mg/dL
KETONES UR: NEGATIVE mg/dL
Nitrite: POSITIVE — AB
PROTEIN: NEGATIVE mg/dL
Specific Gravity, Urine: 1.022 (ref 1.005–1.030)
pH: 6.5 (ref 5.0–8.0)

## 2016-09-07 LAB — TSH: TSH: 1.878 u[IU]/mL (ref 0.350–4.500)

## 2016-09-07 NOTE — Progress Notes (Signed)
Patient with abnormal urinalysis, but specimen appears to be contaminated, she is afebrile, denies any dysuria or polyuria, no indication for antibiotic or evidence of UTI diagnosis. [name] MD

## 2016-09-07 NOTE — [facility] Group Notes (Signed)
[facility] LCSW Group Therapy 09/07/2016 1:15 PM  Type of Therapy: Group Therapy- Feelings about Diagnosis  Participation Level: Minimal  Participation Quality:  Disengaged  Affect:  Flat  Cognitive: Alert and Oriented   Insight:  Unable to assess  Engagement in Therapy: Developing/Improving and Engaged   Modes of Intervention: Clarification, Confrontation, Discussion, Education, Exploration, Limit-setting, Orientation, Problem-solving, Rapport Building, Dance movement psychotherapisteality Testing, Socialization and Support  Description of Group:   This group will allow patients to explore their thoughts and feelings about diagnoses they have received. Patients will be guided to explore their level of understanding and acceptance of these diagnoses. Facilitator will encourage patients to process their thoughts and feelings about the reactions of others to their diagnosis, and will guide patients in identifying ways to discuss their diagnosis with significant others in their lives. This group will be process-oriented, with patients participating in exploration of their own experiences as well as giving and receiving support and challenge from other group members.  Summary of Progress/Problems:  Pt did not participate in group discussion and appeared withdrawn throughout most of the session.  Therapeutic Modalities:   Cognitive Behavioral Therapy Solution Focused Therapy Motivational Interviewing Relapse Prevention Therapy  Chad CordialLauren Carter, LCSWA 09/07/2016 4:17 PM
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
## 2016-09-07 NOTE — Progress Notes (Signed)
Patient ID: Amber Bernard, female   DOB: 1986/10/13, 30 y.o.   MRN: [id number]   Pt currently presents with a flat, depressed affect and apprehensive behavior. Pt forwards little to Clinical research associatewriter. Pt denies any goals or current issues. Pt seen sitting in dayroom, no interaction with other patients noted. Pt reports good sleep with current medication regimen.   Pt provided with medications per providers orders. Pt's labs and vitals were monitored throughout the night. Pt supported emotionally and encouraged to express concerns and questions.   Pt's safety ensured with 15 minute and environmental checks. Pt currently denies SI/HI and A/V hallucinations. Pt verbally agrees to seek staff if SI/HI or A/VH occurs and to consult with staff before acting on any harmful thoughts. Will continue POC.

## 2016-09-07 NOTE — Plan of Care (Signed)
Problem: Safety: Goal: Periods of time without injury will increase Outcome: Progressing Patient has not engaged in self harm.

## 2016-09-07 NOTE — Progress Notes (Signed)
Recreation Therapy Notes  Animal-Assisted Activity (AAA) Program Checklist/Progress Notes Patient Eligibility Criteria Checklist & Daily Group note for Rec TxIntervention  Date: 10.17.2017 Time: 2:45pm [address]    AAA/T Program Assumption of Risk Form signed by Patient/ or Parent Legal Guardian Yes  Patient is free of allergies or sever asthma Yes  Patient reports no fear of animals Yes  Patient reports no history of cruelty to animals Yes  Patient understands his/her participation is voluntary Yes  Patient washes hands before animal contact Yes  Patient washes hands after animal contact Yes  Behavioral Response: Engaged, Attentive   Education:Hand Washing, Appropriate Animal Interaction   Education Outcome: Acknowledges education.   Clinical Observations/Feedback: Patient attended session and interacted appropriately with therapy dog and peers. Patient asked appropriate questions about therapy dog and his training. Patient shared stories about their pets at home with group.   Amber Bernard, LRT/CTRS        Amber Bernard L 09/07/2016 3:07 PM

## 2016-09-07 NOTE — Progress Notes (Signed)
Met with patient 1:1. Patient turns body away from this writer, gives little to no eye contact. Affect flat, mood depressed. Presents as distressed however denies any complaints, no depression. No pain or physical complaints.  No meds ordered at this time. Emotional support offered and self inventory completion encouraged. Also encouraged completion of Suicide Safety Plan. Discussed POC with MD, SW.  Patient verbalizes understanding of POC. Patient denies SI/HI and remains safe on level III obs.

## 2016-09-07 NOTE — B[facility] Group Notes (Signed)
Unit rules were explained and writer asked the patients how their day was? Pt stated her day was a 9. When writer ask why is was a 9 pt stated she just had a good day.

## 2016-09-08 DIAGNOSIS — F331 Major depressive disorder, recurrent, moderate: Secondary | ICD-10-CM

## 2016-09-08 DIAGNOSIS — F17210 Nicotine dependence, cigarettes, uncomplicated: Secondary | ICD-10-CM

## 2016-09-08 LAB — URINE CULTURE: SPECIAL REQUESTS: NORMAL

## 2016-09-08 MED ORDER — HYDROXYZINE HCL 25 MG PO TABS: 25.0000 mg | ORAL_TABLET | Freq: Three times a day (TID) | ORAL | 0 refills | Status: DC | PRN

## 2016-09-08 MED ORDER — TRAZODONE HCL 50 MG PO TABS: 50.0000 mg | ORAL_TABLET | Freq: Every evening | ORAL | 0 refills | Status: DC | PRN

## 2016-09-08 MED ORDER — MIRTAZAPINE 15 MG PO TABS: 15.0000 mg | ORAL_TABLET | Freq: Every day | ORAL | 0 refills | Status: DC

## 2016-09-08 NOTE — Tx Team (Signed)
Interdisciplinary Treatment and Diagnostic Plan Update  09/08/2016 Time of Session: 9:57 AM  Amber Bernard MRN: [id number]  Principal Diagnosis: Major depressive disorder, recurrent episode, moderate ([place])  Secondary Diagnoses: Principal Problem:   Major depressive disorder, recurrent episode, moderate (HCC) Active Problems:   Abnormal urinalysis   Current Medications:  Current Facility-Administered Medications  Medication Dose Route Frequency Provider Last Rate Last Dose  . acetaminophen (TYLENOL) tablet 650 mg  650 mg Oral Q6H PRN Rozetta Nunnery, NP      . alum & mag hydroxide-simeth (MAALOX/MYLANTA) 200-200-20 MG/5ML suspension 30 mL  30 mL Oral Q4H PRN Rozetta Nunnery, NP      . hydrOXYzine (ATARAX/VISTARIL) tablet 25 mg  25 mg Oral TID PRN Rozetta Nunnery, NP      . magnesium hydroxide (MILK OF MAGNESIA) suspension 30 mL  30 mL Oral Daily PRN Rozetta Nunnery, NP      . mirtazapine (REMERON) tablet 15 mg  15 mg Oral QHS Rozetta Nunnery, NP   15 mg at 09/07/16 2151  . traZODone (DESYREL) tablet 50 mg  50 mg Oral QHS PRN Jenne Campus, MD        PTA Medications: Prescriptions Prior to Admission  Medication Sig Dispense Refill Last Dose  . mirtazapine (REMERON) 15 MG tablet Take 15 mg by mouth at bedtime.   09/06/2016 at Unknown time    Treatment Modalities: Medication Management, Group therapy, Case management,  1 to 1 session with clinician, Psychoeducation, Recreational therapy.  Patient Stressors: Marital or family conflict Medication change or noncompliance Substance abuse  Patient Strengths: Capable of independent living Armed forces logistics/support/administrative officer Physical Health Supportive family/friends  Physician Treatment Plan for Primary Diagnosis: Major depressive disorder, recurrent episode, moderate ([place]) Long Term Goal(s): Improvement in symptoms so as ready for discharge  Short Term Goals: Ability to verbalize feelings will improve Ability to disclose and discuss suicidal  ideas Ability to identify and develop effective coping behaviors will improve Ability to verbalize feelings will improve Ability to disclose and discuss suicidal ideas Ability to identify and develop effective coping behaviors will improve  Medication Management: Evaluate patient's response, side effects, and tolerance of medication regimen.  Therapeutic Interventions: 1 to 1 sessions, Unit Group sessions and Medication administration.  Evaluation of Outcomes: Adequate for Discharge  Physician Treatment Plan for Secondary Diagnosis: Principal Problem:   Major depressive disorder, recurrent episode, moderate (HCC) Active Problems:   Abnormal urinalysis   Long Term Goal(s): Improvement in symptoms so as ready for discharge  Short Term Goals: Ability to verbalize feelings will improve Ability to disclose and discuss suicidal ideas Ability to identify and develop effective coping behaviors will improve Ability to verbalize feelings will improve Ability to disclose and discuss suicidal ideas Ability to identify and develop effective coping behaviors will improve  Medication Management: Evaluate patient's response, side effects, and tolerance of medication regimen.  Therapeutic Interventions: 1 to 1 sessions, Unit Group sessions and Medication administration.  Evaluation of Outcomes: Adequate for Discharge   RN Treatment Plan for Primary Diagnosis: Major depressive disorder, recurrent episode, moderate (HCC) Long Term Goal(s): Knowledge of disease and therapeutic regimen to maintain health will improve  Short Term Goals: Ability to verbalize feelings will improve, Ability to disclose and discuss suicidal ideas and Ability to identify and develop effective coping behaviors will improve  Medication Management: RN will administer medications as ordered by provider, will assess and evaluate patient's response and provide education to patient for prescribed medication. RN will report any  adverse and/or side effects to prescribing provider.  Therapeutic Interventions: 1 on 1 counseling sessions, Psychoeducation, Medication administration, Evaluate responses to treatment, Monitor vital signs and CBGs as ordered, Perform/monitor CIWA, COWS, AIMS and Fall Risk screenings as ordered, Perform wound care treatments as ordered.  Evaluation of Outcomes: Adequate for Discharge   LCSW Treatment Plan for Primary Diagnosis: Major depressive disorder, recurrent episode, moderate ([place]) Long Term Goal(s): Safe transition to appropriate next level of care at discharge, Engage patient in therapeutic group addressing interpersonal concerns.  Short Term Goals: Engage patient in aftercare planning with referrals and resources, Increase emotional regulation, Facilitate acceptance of mental health diagnosis and concerns and Increase skills for wellness and recovery  Therapeutic Interventions: Assess for all discharge needs, 1 to 1 time with Social worker, Explore available resources and support systems, Assess for adequacy in community support network, Educate family and significant other(s) on suicide prevention, Complete Psychosocial Assessment, Interpersonal group therapy.  Evaluation of Outcomes: Met   Progress in Treatment: Attending groups: Yes  Participating in groups: Yes Taking medication as prescribed: Yes,Toleration medication: Yes, no side effects reported at this time Family/Significant other contact made: No, Pt declines Patient understands diagnosis: Yes Discussing patient identified problems/goals with staff: Yes Medical problems stabilized or resolved: Yes Denies suicidal/homicidal ideation: Yes Issues/concerns per patient self-inventory: None Other: N/A  New problem(s) identified: None identified at this time.   New Short Term/Long Term Goal(s): None identified at this time.   Discharge Plan or Barriers: Pt will return home and follow-up with outpatient services.    Reason for Continuation of Hospitalization: Anxiety Depression Medication stabilization Suicidal ideation  Estimated Length of Stay: 1-3 days  Attendees: Patient: 09/08/2016  9:57 AM  Physician: Army Chaco MD 09/08/2016  9:57 AM  Nursing: Madelaine Etienne 09/08/2016  9:57 AM  RN Care Manager: Lars Pinks, RN 09/08/2016  9:57 AM  Social Worker: Annamarie Major 09/08/2016  9:57 AM  Recreational Therapist:  09/08/2016  9:57 AM  Other: Calton Golds, Catalina Pizza; NP 09/08/2016  9:57 AM  Other:  09/08/2016  9:57 AM  Other: 09/08/2016  9:57 AM    Scribe for Treatment Team: Edwyna Shell, LCSW 09/08/2016 9:57 AM

## 2016-09-08 NOTE — Progress Notes (Signed)
D:  Patient's self inventory sheet, patient sleeps good, no sleep medication given.  Good appetite, normal energy level, good concentration.  Denied depression, hopeless and anxiety.  Denied withdrawals.  Denied SI.  Denied physical pain.  Goal is to discharge.  Plans to do "whatever it takes".  Does have discharge plans. A:  Medications administered per MD orders.  Emotional support and encouragement given patient. R:  Denied SI and HI, contracts for safety.  Denied A/V hallucinations.  Safety maintained with 15 minute checks.

## 2016-09-08 NOTE — [facility] Suicide Risk Assessment (Signed)
[facility]   Principal Problem: Major depressive disorder, recurrent episode, moderate (HCC) Discharge Diagnoses:  Patient Active Problem List   Diagnosis Date Noted  . Major depressive disorder, recurrent episode, moderate (HCC) [F33.1] 09/08/2016  . Abnormal urinalysis [R82.90]     Total Time spent with patient: 30 minutes  Musculoskeletal: Strength & Muscle Tone: within normal limits Gait & Station: normal Patient leans: N/A  Psychiatric Specialty Exam: Review of Systems  Psychiatric/Behavioral: Negative for depression and hallucinations. The patient is not nervous/anxious.   All other systems reviewed and are negative.   Blood pressure 118/84, pulse 73, temperature 98.4 F (36.9 C), temperature source Oral, resp. rate 16, height 5\' 5"  (1.651 m), weight 57.2 kg (126 lb), SpO2 100 %.Body mass index is 20.97 kg/m.  General Appearance: Casual  Eye Contact::  Fair  Speech:  Clear and Coherent409  Volume:  Normal  Mood:  Euthymic  Affect:  Appropriate  Thought Process:  Goal Directed and Descriptions of Associations: Intact  Orientation:  Full (Time, Place, and Person)  Thought Content:  Logical  Suicidal Thoughts:  No  Homicidal Thoughts:  No  Memory:  Immediate;   Fair Recent;   Fair Remote;   Fair  Judgement:  Fair  Insight:  Fair  Psychomotor Activity:  Normal  Concentration:  Fair  Recall:  FiservFair  Fund of Knowledge:Fair  Language: Fair  Akathisia:  No  Handed:  Right  AIMS (if indicated):     Assets:  Desire for Improvement  Sleep:  Number of Hours: 5  Cognition: WNL  ADL's:  Intact   Mental Status Per Nursing Assessment::   On Admission:  Suicidal ideation indicated by others  Demographic Factors:  Adolescent or young adult  Loss Factors: NA  Historical Factors: Impulsivity  Risk Reduction Factors:   Positive social support and Positive therapeutic relationship  Continued Clinical Symptoms:  Previous Psychiatric  Diagnoses and Treatments  Cognitive Features That Contribute To Risk:  None    Suicide Risk:  Minimal: No identifiable suicidal ideation.  Patients presenting with no risk factors but with morbid ruminations; may be classified as minimal risk based on the severity of the depressive symptoms  Follow-up Information    [facility] .   Specialty:  Professional Counselor Why:  Please use their walk-in clinic within 1-3 days of discharge to be established for therapy services. Walk-in hours are Monday-Friday from 8am-12pm Contact information: [address] [place] [id number] [phone]        [facility] .   Why:  Initial appointment for medications management w Dr Jeri LagerPavlock on Monday Oct 23 at 9:30 AM.  Please call to cancel/reschedule if needed.   Contact information: [address] [place] [address] Phone:  [phone] Fax:  [phone]           Plan Of Care/Follow-up recommendations:  Activity:  no restrictions Diet:  regular Tests:  as needed Other:  follow up with aftercare  Carlina Derks, MD 09/08/2016, 9:43 AM

## 2016-09-08 NOTE — Progress Notes (Signed)
Discharge Note: Patient discharged home with bus ticket.  Patient denied SI and HI.  Denied A/V hallucinations.  Suicide prevention information given and discussed with patient who stated she understood and had no questions.  Patient stated she received all her belongings, ear buds, keys, shoes, cell phone  (2), spinner, lighter, misc items, clothing, toiletries, medications, prescriptions, etc.  All required discharge information given to patient at discharge.  Patient stated she [facility] all [facility].

## 2016-09-08 NOTE — Progress Notes (Addendum)
Recreation Therapy Notes  Date: 09/08/16 Time: 0930 Location: [address] Day[address]  Group Topic: Stress Management  Goal Area(s) Addresses:  Patient will verbalize importance of using healthy stress management. Patient will identify positive emotions associated with healthy stress management.   Intervention: Stress Management  Activity: Progressive Muscle Relaxation.  LRT introduced the stress management technique of progressive muscle relaxation.  LRT read a script to allow patients to participate in activity.  Patients were to follow along as LRT read script.  Education:  Stress Management, Discharge Planning  Education Outcome: Acknowledges education/In group clarification offered/Needs additional education  Clinical Observations/Feedback: Pt did not attend group.   Rodderick Holtzer, LRT/CTRS         Abbigaile Rockman A 09/08/2016 12:14 PM 

## 2016-09-08 NOTE — Progress Notes (Signed)
  [facility] :  Will you be returning to the same living situation after discharge:  Yes,  return home At discharge, do you have transportation home?: Yes,  girlfriend Do you have the ability to pay for your medications: Yes,  no concerns expressed  Release of information consent forms completed and in the chart;  Patient's signature needed at discharge.  Patient to Follow up at: Follow-up Information    [facility] .   Specialty:  Professional Counselor Why:  Please use their walk-in clinic within 1-3 days of discharge to be established for therapy services. Walk-in hours are Monday-Friday from 8am-12pm Contact information: [address] [place] [address] [phone]        [facility] .   Why:  Initial appointment for medications management w Dr Jeri LagerPavlock on Monday Oct 23 at 9:30 AM.  Please call to cancel/reschedule if needed.   Contact information: [address] [place] [address] Phone:  [phone] Fax:  [phone]           Next level of care provider has [facility]:yes  Safety Planning and Suicide Prevention discussed: Yes,  reviewed w patient, patient refused consent for collateral contact  Have you used any form of tobacco in the last 30 days? (Cigarettes, Smokeless Tobacco, Cigars, and/or Pipes): Yes  Has patient been referred to the Quitline?: Patient refused referral  Patient has been referred for addiction treatment: Yes  Sallee Langenne C Kymber Kosar 09/08/2016, 5:33 PM
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
## 2017-01-26 ENCOUNTER — Emergency Department (HOSPITAL_COMMUNITY)
Admission: EM | Admit: 2017-01-26 | Discharge: 2017-01-26 | Disposition: A | Discharge status: Home / Self Care | Payer: Medicaid Other | Attending: Emergency Medicine | Admitting: Emergency Medicine

## 2017-01-26 DIAGNOSIS — T7840XA Allergy, unspecified, initial encounter: Secondary | ICD-10-CM | POA: Insufficient documentation

## 2017-01-26 DIAGNOSIS — F17200 Nicotine dependence, unspecified, uncomplicated: Secondary | ICD-10-CM | POA: Insufficient documentation

## 2017-01-26 DIAGNOSIS — F909 Attention-deficit hyperactivity disorder, unspecified type: Secondary | ICD-10-CM | POA: Insufficient documentation

## 2017-01-26 DIAGNOSIS — Z79899 Other long term (current) drug therapy: Secondary | ICD-10-CM | POA: Insufficient documentation

## 2017-01-26 DIAGNOSIS — J45909 Unspecified asthma, uncomplicated: Secondary | ICD-10-CM | POA: Diagnosis not present

## 2017-01-26 MED ORDER — PREDNISONE 20 MG PO TABS: 40.0000 mg | ORAL_TABLET | Freq: Every day | ORAL | 0 refills | Status: DC

## 2017-01-26 MED ORDER — DIPHENHYDRAMINE HCL 25 MG PO TABS: 25.0000 mg | ORAL_TABLET | Freq: Four times a day (QID) | ORAL | 0 refills | Status: DC | PRN

## 2017-01-26 MED ORDER — DIPHENHYDRAMINE HCL 50 MG/ML IJ SOLN
25.0000 mg | Freq: Once | INTRAMUSCULAR | Status: AC
Start: 2017-01-26 — End: 2017-01-26
  Administered 2017-01-26: 25 mg via INTRAVENOUS
  Filled 2017-01-26: qty 1

## 2017-01-26 MED ORDER — FAMOTIDINE 20 MG PO TABS: 20.0000 mg | ORAL_TABLET | Freq: Two times a day (BID) | ORAL | 0 refills | Status: DC

## 2017-01-26 MED ORDER — EPINEPHRINE 0.3 MG/0.3ML IJ SOAJ
0.3000 mg | Freq: Once | INTRAMUSCULAR | Status: DC | PRN
  Filled 2017-01-26: qty 0.3

## 2017-01-26 MED ORDER — FAMOTIDINE IN NACL 20-0.9 MG/50ML-% IV SOLN
20.0000 mg | INTRAVENOUS | Status: AC
  Administered 2017-01-26: 20 mg via INTRAVENOUS
  Filled 2017-01-26: qty 50

## 2017-01-26 MED ORDER — EPINEPHRINE 0.3 MG/0.3ML IJ SOAJ: 0.3000 mg | Freq: Once | INTRAMUSCULAR | 1 refills | Status: DC | PRN

## 2017-01-26 MED ORDER — METHYLPREDNISOLONE SODIUM SUCC 125 MG IJ SOLR
125.0000 mg | Freq: Once | INTRAMUSCULAR | Status: AC
  Administered 2017-01-26: 125 mg via INTRAVENOUS
  Filled 2017-01-26: qty 2

## 2017-01-26 NOTE — ED Notes (Signed)
Patient given portable phone and told to call fiance, as she was calling to check on patient and wanting to speak with patient.

## 2017-01-26 NOTE — ED Notes (Signed)
Patient given sandwich and Sprite per her request. RN okayed.

## 2017-01-26 NOTE — ED Provider Notes (Signed)
[facility]-EMERGENCY DEPT Provider Note   CSN: [id number] Arrival date & time: 01/26/17  1458     History   Chief Complaint Chief Complaint  Patient presents with  . Allergic Reaction    HPI Amber Bernard is a 31 y.o. female.  Patient presents to the ED with a chief complaint of allergic reaction.  She states that she was walking down a path and stumbled into some pine needles while trying to avoid a pile of vomit.  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  She reports swelling of her lips, wheezing, and nausea.  She denies any rash.  She an epipen was administered by EMS along with an albuterol treatment.  She states that she still has some wheezing and tightness in her chest and feels nauseated.  She denies any rash.  There are no other associated symptoms.   The history is provided by the patient. No language interpreter was used.    Past Medical History:  Diagnosis Date  . ADD (attention deficit disorder)   . ADHD (attention deficit hyperactivity disorder)   . Anxiety   . Asthma   . MDD (major depressive disorder)   . PTSD (post-traumatic stress disorder)   . Sickle cell trait [facility](HCC)     Patient Active Problem List   Diagnosis Date Noted  . Major depressive disorder, recurrent episode, moderate (HCC) 09/08/2016  . Abnormal urinalysis     Past Surgical History:  Procedure Laterality Date  . APPENDECTOMY      OB History    No data available       Home Medications    Prior to Admission medications   Medication Sig Start Date End Date Taking? Authorizing Provider  hydrOXYzine (ATARAX/VISTARIL) 25 MG tablet Take 1 tablet (25 mg total) by mouth 3 (three) times daily as needed for anxiety. 09/08/16   Beau FannyJohn C Withrow, FNP  mirtazapine (REMERON) 15 MG tablet Take 1 tablet (15 mg total) by mouth at bedtime. 09/08/16   Beau FannyJohn C Withrow, FNP  traZODone (DESYREL) 50 MG tablet Take 1 tablet (50 mg total) by mouth at bedtime as needed for sleep. 09/08/16   Beau FannyJohn C  Withrow, FNP    Family History No family history on file.  Social History Social History  Substance Use Topics  . Smoking status: Current Every Day Smoker  . Smokeless tobacco: Never Used  . Alcohol use No     Allergies   Pine needle [fir oil]; Tomasa BlaseBacon flavor; and Sausage [pickled meat]   Review of Systems Review of Systems  Respiratory: Positive for wheezing.   Gastrointestinal: Positive for nausea. Negative for vomiting.  Skin: Negative for rash.  All other systems reviewed and are negative.    Physical Exam Updated Vital Signs BP 130/65 (BP Location: Right Arm)   Pulse 84   Temp 98.3 F (36.8 C) (Oral)   Resp 24   LMP 01/20/2017   SpO2 100%   Physical Exam  Constitutional: She is oriented to person, place, and time. She appears well-developed and well-nourished.  HENT:  Head: Normocephalic and atraumatic.  Mild lip swelling, no tongue swelling, no stridor, airway intact  Eyes: Conjunctivae and EOM are normal. Pupils are equal, round, and reactive to light.  Neck: Normal range of motion. Neck supple.  Cardiovascular: Normal rate and regular rhythm.  Exam reveals no gallop and no friction rub.   No murmur heard. Pulmonary/Chest: Effort normal. No respiratory distress. She has wheezes. She has no rales. She exhibits  no tenderness.  Faint right sided wheezes  Abdominal: Soft. Bowel sounds are normal. She exhibits no distension and no mass. There is no tenderness. There is no rebound and no guarding.  Musculoskeletal: Normal range of motion. She exhibits no edema or tenderness.  Neurological: She is alert and oriented to person, place, and time.  Skin: Skin is warm and dry.  No rash  Psychiatric: She has a normal mood and affect. Her behavior is normal. Judgment and thought content normal.  Nursing note and vitals reviewed.    ED Treatments / Results  Labs (all labs ordered are listed, but only abnormal results are displayed) Labs Reviewed - No data to  display  EKG  EKG Interpretation None       Radiology No results found.  Procedures Procedures (including critical care time)  Medications Ordered in ED Medications  EPINEPHrine (EPI-PEN) injection 0.3 mg (not administered)  methylPREDNISolone sodium succinate (SOLU-MEDROL) 125 mg/2 mL injection 125 mg (125 mg Intravenous Given 01/26/17 1531)  famotidine (PEPCID) IVPB 20 mg premix (20 mg Intravenous New Bag/Given 01/26/17 1530)  diphenhydrAMINE (BENADRYL) injection 25 mg (25 mg Intravenous Given 01/26/17 1531)     Initial Impression / Assessment and Plan / ED Course  I have reviewed the triage vital signs and the nursing notes.  Pertinent labs & imaging results that were available during my care of the patient were reviewed by me and considered in my medical decision making (see chart for details).     Patient with allergic reaction to pine needles.  Had lip swelling, difficulty breathing, wheezing, and nausea.  Given epipen and neb by EMS with good improvement.   Appears stable, epipen at bedside, will give solumedrol, benadryl, and pepcid.  Will reassess.  6:53 PM Patient reassessed every 2 hours. She feels well now. Her symptoms have improved. She appears stable for discharge and outpatient follow-up. I have discussed following up with allergy specialist.  Return precautions discussed.  Final Clinical Impressions(s) / ED Diagnoses   Final diagnoses:  Allergic reaction, initial encounter    New Prescriptions New Prescriptions   DIPHENHYDRAMINE (BENADRYL) 25 MG TABLET    Take 1 tablet (25 mg total) by mouth every 6 (six) hours as needed for itching (Rash).   EPINEPHRINE (EPIPEN 2-PAK) 0.3 MG/0.3 ML IJ SOAJ INJECTION    Inject 0.3 mLs (0.3 mg total) into the muscle once as needed (for severe allergic reaction). CAll 911 immediately if you have to use this medicine   FAMOTIDINE (PEPCID) 20 MG TABLET    Take 1 tablet (20 mg total) by mouth 2 (two) times daily.   PREDNISONE  (DELTASONE) 20 MG TABLET    Take 2 tablets (40 mg total) by mouth daily.     Roxy Horseman, PA-C 01/26/17 1855    Gerhard Munch, MD 01/27/17 2107
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
## 2017-01-26 NOTE — ED Notes (Signed)
Bed: RESA Expected date:  Expected time:  Means of arrival:  Comments: EMS/anaphylaxis 

## 2017-06-02 ENCOUNTER — Emergency Department (HOSPITAL_BASED_OUTPATIENT_CLINIC_OR_DEPARTMENT_OTHER)
Admission: EM | Admit: 2017-06-02 | Discharge: 2017-06-02 | Disposition: A | Discharge status: Home / Self Care | Payer: Medicaid Other | Attending: Emergency Medicine | Admitting: Emergency Medicine

## 2017-06-02 ENCOUNTER — Encounter (HOSPITAL_BASED_OUTPATIENT_CLINIC_OR_DEPARTMENT_OTHER): Payer: Self-pay | Admitting: *Deleted

## 2017-06-02 DIAGNOSIS — F909 Attention-deficit hyperactivity disorder, unspecified type: Secondary | ICD-10-CM | POA: Insufficient documentation

## 2017-06-02 DIAGNOSIS — J45909 Unspecified asthma, uncomplicated: Secondary | ICD-10-CM | POA: Diagnosis not present

## 2017-06-02 DIAGNOSIS — R55 Syncope and collapse: Secondary | ICD-10-CM

## 2017-06-02 DIAGNOSIS — Z87891 Personal history of nicotine dependence: Secondary | ICD-10-CM | POA: Diagnosis not present

## 2017-06-02 LAB — CBC WITH DIFFERENTIAL/PLATELET
Basophils Absolute: 0 10*3/uL (ref 0.0–0.1)
Basophils Relative: 0 %
EOS ABS: 0.1 10*3/uL (ref 0.0–0.7)
EOS PCT: 1 %
HCT: 32.2 % — ABNORMAL LOW (ref 36.0–46.0)
HEMOGLOBIN: 11.1 g/dL — AB (ref 12.0–15.0)
LYMPHS ABS: 1.4 10*3/uL (ref 0.7–4.0)
Lymphocytes Relative: 16 %
MCH: 29.7 pg (ref 26.0–34.0)
MCHC: 34.5 g/dL (ref 30.0–36.0)
MCV: 86.1 fL (ref 78.0–100.0)
MONO ABS: 0.5 10*3/uL (ref 0.1–1.0)
Monocytes Relative: 6 %
Neutro Abs: 6.5 10*3/uL (ref 1.7–7.7)
Neutrophils Relative %: 77 %
PLATELETS: 221 10*3/uL (ref 150–400)
RBC: 3.74 MIL/uL — ABNORMAL LOW (ref 3.87–5.11)
RDW: 14.1 % (ref 11.5–15.5)
WBC: 8.5 10*3/uL (ref 4.0–10.5)

## 2017-06-02 LAB — BASIC METABOLIC PANEL
Anion gap: 12 (ref 5–15)
BUN: 19 mg/dL (ref 6–20)
CHLORIDE: 106 mmol/L (ref 101–111)
CO2: 21 mmol/L — ABNORMAL LOW (ref 22–32)
Calcium: 8.8 mg/dL — ABNORMAL LOW (ref 8.9–10.3)
Creatinine, Ser: 0.75 mg/dL (ref 0.44–1.00)
GFR calc Af Amer: >60 mL/min (ref >60)
GFR calc non Af Amer: >60 mL/min (ref >60)
Glucose, Bld: 84 mg/dL (ref 65–99)
Potassium: 3.6 mmol/L (ref 3.5–5.1)
SODIUM: 139 mmol/L (ref 135–145)

## 2017-06-02 LAB — CBG MONITORING, ED
GLUCOSE-CAPILLARY: 81 mg/dL (ref 65–99)
Glucose-Capillary: 95 mg/dL (ref 65–99)

## 2017-06-02 LAB — TROPONIN I: Troponin I: <0.03 ng/mL (ref <0.03)

## 2017-06-02 MED ORDER — SODIUM CHLORIDE 0.9 % IV BOLUS (SEPSIS)
500.0000 mL | Freq: Once | INTRAVENOUS | Status: AC
  Administered 2017-06-02: 500 mL via INTRAVENOUS

## 2017-06-02 NOTE — ED Notes (Signed)
Pt unable to contact ride. Cab voucher provided. Pt alert and oriented. Ambulatory without need for assist

## 2017-06-02 NOTE — ED Provider Notes (Signed)
[facility]   CSN: [id number] Arrival date & time: 06/02/17  4098     History   Chief Complaint Chief Complaint  Patient presents with  . Weakness    HPI Amber Bernard is a 31 y.o. female.  The history is provided by the patient.  Near Syncope  This is a new problem. The current episode started less than 1 hour ago. The problem occurs constantly. The problem has been resolved. Pertinent negatives include no chest pain, no abdominal pain, no headaches and no shortness of breath. Nothing aggravates the symptoms. Nothing relieves the symptoms. Treatments tried: glucose. The treatment provided significant relief.  Has been out walking all day and not eaten or drank in almost 18 hours.    Past Medical History:  Diagnosis Date  . ADD (attention deficit disorder)   . ADHD (attention deficit hyperactivity disorder)   . Anxiety   . Asthma   . MDD (major depressive disorder)   . PTSD (post-traumatic stress disorder)   . Sickle cell trait [facility])     Patient Active Problem List   Diagnosis Date Noted  . Major depressive disorder, recurrent episode, moderate (HCC) 09/08/2016  . Abnormal urinalysis     Past Surgical History:  Procedure Laterality Date  . APPENDECTOMY      OB History    No data available       Home Medications    Prior to Admission medications   Medication Sig Start Date End Date Taking? Authorizing Provider  hydrOXYzine (ATARAX/VISTARIL) 25 MG tablet Take 1 tablet (25 mg total) by mouth 3 (three) times daily as needed for anxiety. 09/08/16   Withrow, Everardo All, FNP    Family History History reviewed. No pertinent family history.  Social History Social History  Substance Use Topics  . Smoking status: Current Every Day Smoker  . Smokeless tobacco: Never Used  . Alcohol use No     Allergies   Pine needle [fir oil]; Tomasa Blase flavor; and Sausage [pickled meat]   Review of Systems Review of Systems  Constitutional: Negative  for fever.  Respiratory: Negative for shortness of breath.   Cardiovascular: Positive for near-syncope. Negative for chest pain, palpitations and leg swelling.  Gastrointestinal: Negative for abdominal pain.  Neurological: Negative for seizures, facial asymmetry, speech difficulty, light-headedness, numbness and headaches.  All other systems reviewed and are negative.    Physical Exam Updated Vital Signs Ht 5\' 6"  (1.676 m)   Wt 54 kg (119 lb)   LMP 05/26/2017   BMI 19.21 kg/m   Physical Exam  Constitutional: She is oriented to person, place, and time. She appears well-developed and well-nourished. No distress.  HENT:  Head: Normocephalic and atraumatic.  Mouth/Throat: Oropharynx is clear and moist. No oropharyngeal exudate.  Eyes: Pupils are equal, round, and reactive to light. Conjunctivae are normal.  Neck: Normal range of motion. Neck supple.  Cardiovascular: Normal rate, regular rhythm, normal heart sounds and intact distal pulses.   Pulmonary/Chest: Effort normal and breath sounds normal. No respiratory distress. She has no wheezes. She has no rales.  Abdominal: Soft. Bowel sounds are normal. There is no tenderness. There is no guarding.  Musculoskeletal: Normal range of motion. She exhibits no edema or tenderness.  Neurological: She is alert and oriented to person, place, and time. She displays normal reflexes.  Skin: Skin is warm and dry. Capillary refill takes less than 2 seconds. She is not diaphoretic.  Psychiatric: She has a normal mood and affect. She expresses  no suicidal ideation.     ED Treatments / Results   Vitals:   06/02/17 0538  BP: (!) 116/57  Pulse: 63  Resp: 14  Temp: 98 F (36.7 C)   Orthostatic VS for the past 24 hrs:  BP- Lying Pulse- Lying BP- Sitting Pulse- Sitting BP- Standing at 0 minutes Pulse- Standing at 0 minutes  06/02/17 0540 109/62 59 122/76 59 109/70 65       Labs (all labs ordered are listed, but only abnormal results are  displayed)  Results for orders placed or performed during the hospital encounter of 06/02/17  CBC with Differential/Platelet  Result Value Ref Range   WBC 8.5 4.0 - 10.5 K/uL   RBC 3.74 (L) 3.87 - 5.11 MIL/uL   Hemoglobin 11.1 (L) 12.0 - 15.0 g/dL   HCT 16.1 (L) 09.6 - 04.5 %   MCV 86.1 78.0 - 100.0 fL   MCH 29.7 26.0 - 34.0 pg   MCHC 34.5 30.0 - 36.0 g/dL   RDW 40.9 81.1 - 91.4 %   Platelets 221 150 - 400 K/uL   Neutrophils Relative % 77 %   Neutro Abs 6.5 1.7 - 7.7 K/uL   Lymphocytes Relative 16 %   Lymphs Abs 1.4 0.7 - 4.0 K/uL   Monocytes Relative 6 %   Monocytes Absolute 0.5 0.1 - 1.0 K/uL   Eosinophils Relative 1 %   Eosinophils Absolute 0.1 0.0 - 0.7 K/uL   Basophils Relative 0 %   Basophils Absolute 0.0 0.0 - 0.1 K/uL  Basic metabolic panel  Result Value Ref Range   Sodium 139 135 - 145 mmol/L   Potassium 3.6 3.5 - 5.1 mmol/L   Chloride 106 101 - 111 mmol/L   CO2 21 (L) 22 - 32 mmol/L   Glucose, Bld 84 65 - 99 mg/dL   BUN 19 6 - 20 mg/dL   Creatinine, Ser 7.82 0.44 - 1.00 mg/dL   Calcium 8.8 (L) 8.9 - 10.3 mg/dL   GFR calc non Af Amer >60 >60 mL/min   GFR calc Af Amer >60 >60 mL/min   Anion gap 12 5 - 15  Troponin I  Result Value Ref Range   Troponin I <0.03 <0.03 ng/mL  POC CBG, ED  Result Value Ref Range   Glucose-Capillary 81 65 - 99 mg/dL  POC CBG, ED  Result Value Ref Range   Glucose-Capillary 95 65 - 99 mg/dL   No results found.  EKG  EKG Interpretation  Date/Time:  Thursday June 02 2017 05:49:29 EDT Ventricular Rate:  59 PR Interval:    QRS Duration: 99 QT Interval:  414 QTC Calculation: 411 R Axis:   61 Text Interpretation:  Sinus rhythm Confirmed by Nicanor Alcon, Ronin Crager ([id number]) on 06/02/2017 5:52:42 AM       Procedures Procedures (including critical care time)  Medications Ordered in ED  Medications  sodium chloride 0.9 % bolus 500 mL (500 mLs Intravenous New Bag/Given 06/02/17 0621)     PO challenged in the ED with food and liquids  and glucose has improved.  Was hydrated as she was out walking for 2 hours.  Patient is sleeping ans I suspect that she is homeless and just needed a place to sleep and was hungry.  She denies any symptoms.    Final Clinical Impressions(s) / ED Diagnoses   I do not feel imaging or additional testing is necessary at  at this time.  Return for fevers, intractable vomiting, pain, or diarrhea, inability to tolerate  liquids, passing out, focal weakness or numbness or any concerns.   The patient is nontoxic-appearing on exam and vital signs are within normal limits.   I have reviewed the triage vital signs and the nursing notes. Pertinent labs &imaging results that were available during my care of the patient were reviewed by me and considered in my medical decision making (see chart for details).  After history, exam, and medical workup I feel the patient has been appropriately medically screened and is safe for discharge home. Pertinent diagnoses were discussed with the patient. Patient was given return precautions.           Jimmie Dattilio, MD 06/02/17 [phone]
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
## 2018-01-22 IMAGING — CR DG KNEE COMPLETE 4+V*L*
4 series · 4 of 4 positions shown · non-contrast
Comparison: None.

CLINICAL DATA: Pain for 3 weeks.  No history of recent trauma.

EXAM:
LEFT KNEE - COMPLETE 4+ VIEW

[t knee lat left]
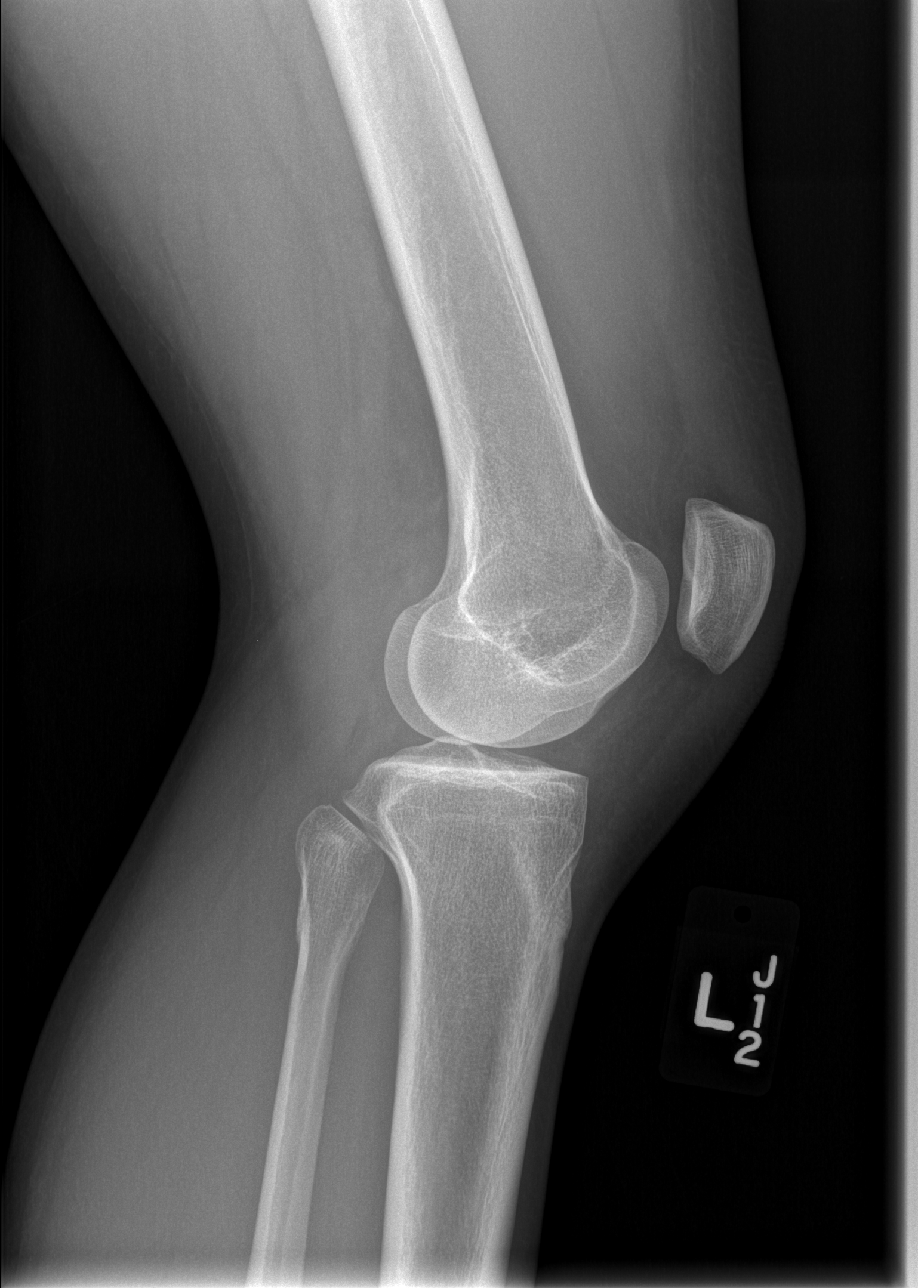

[t knee ap left]
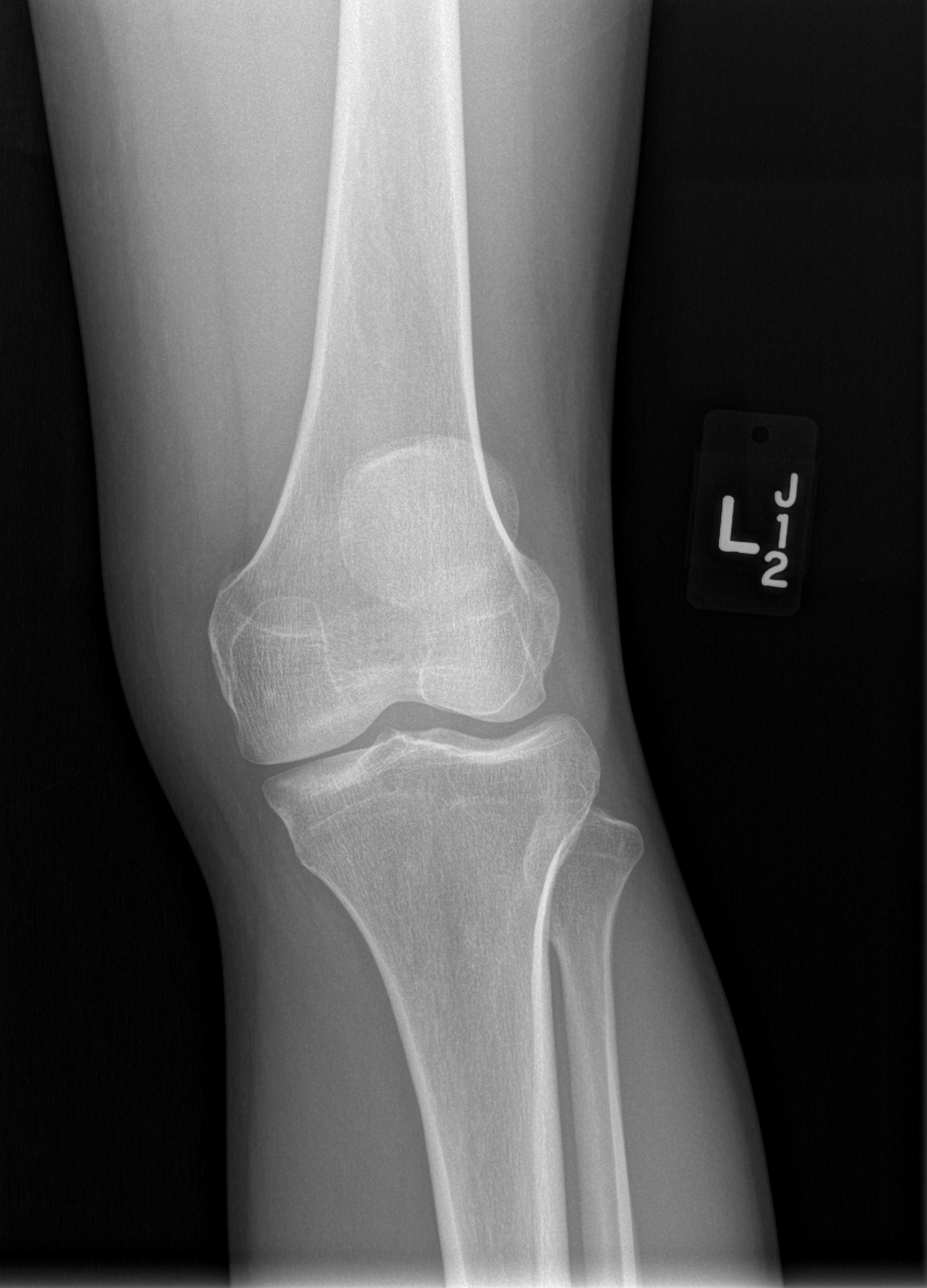

[t knee obl left (1 of 2)]
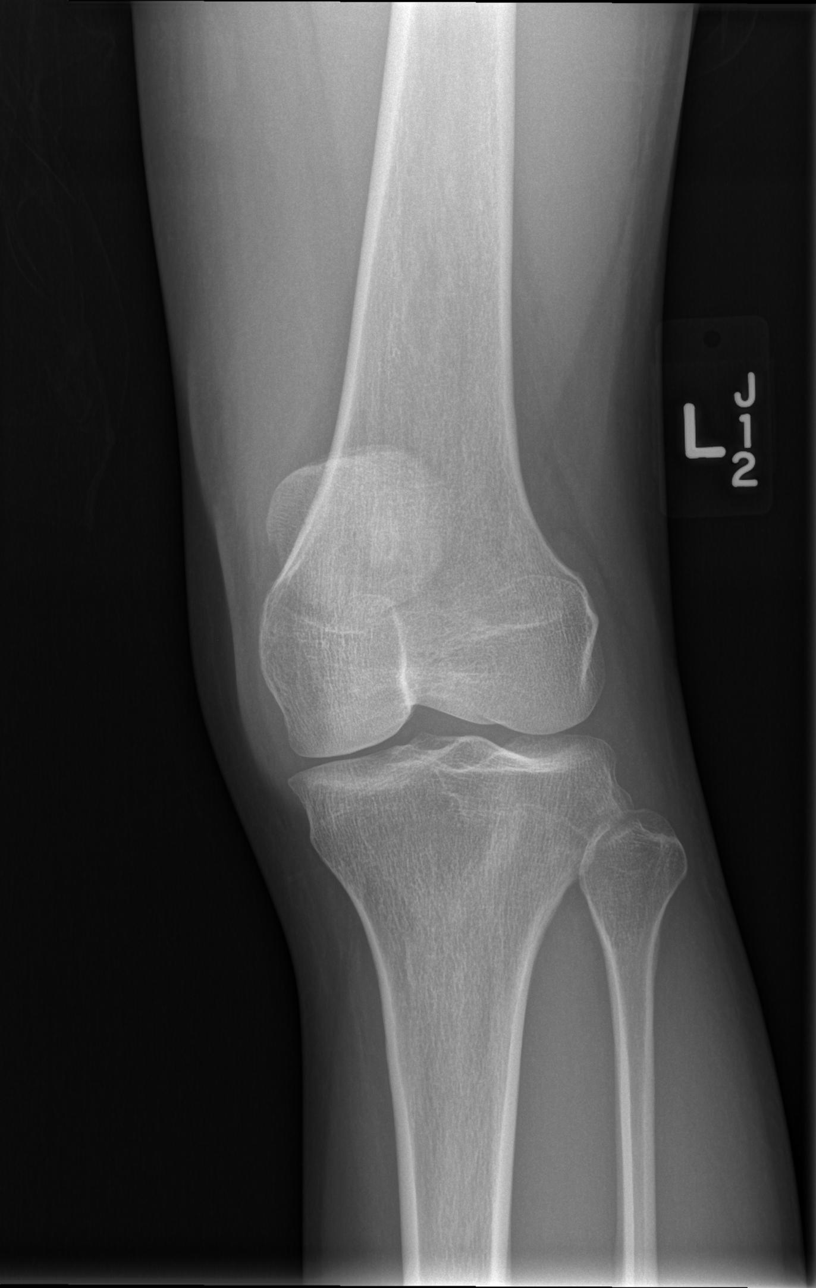

[t knee obl left (2 of 2)]
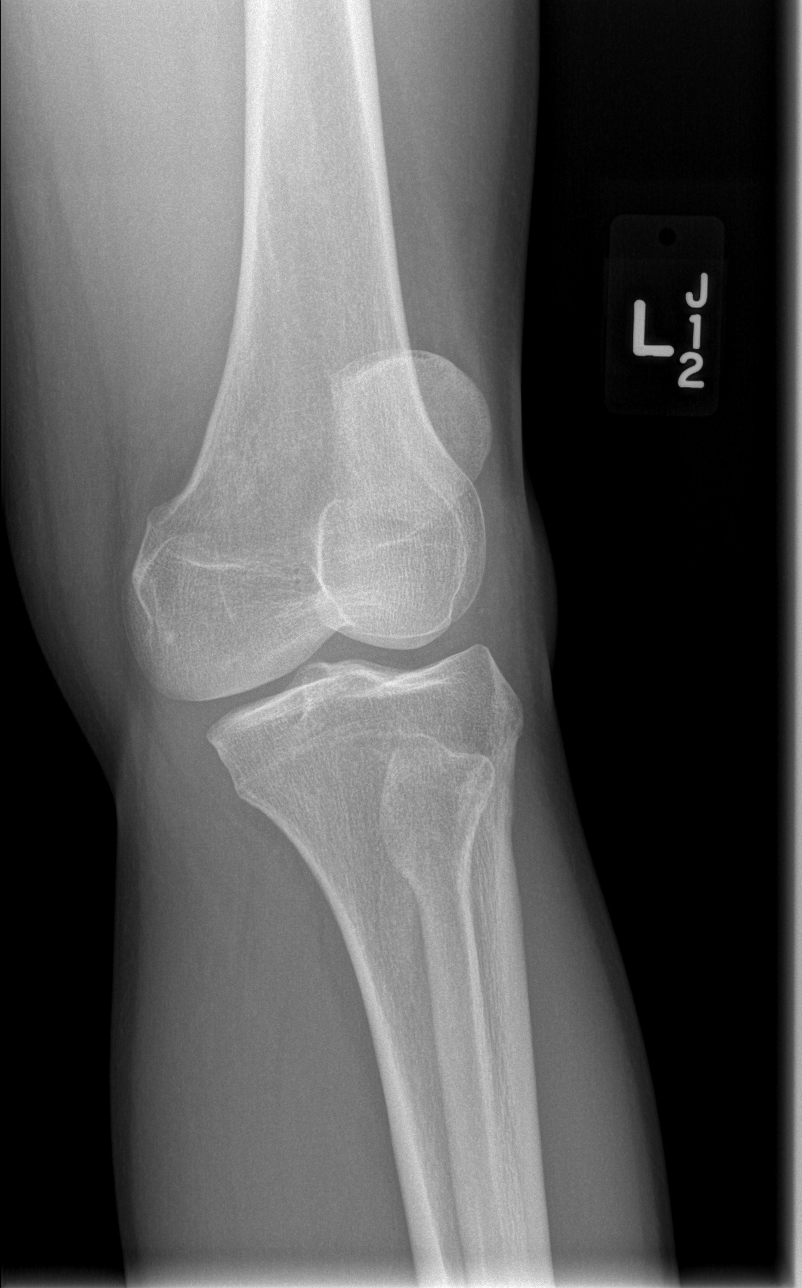

[4 of 4 positions shown; findings below may reference images not displayed]

FINDINGS: Frontal, lateral, and bilateral oblique views were obtained. There
is no demonstrable fracture or dislocation. There is no appreciable
joint effusion. Joint spaces appear normal. No erosive change.
IMPRESSION: No fracture or joint effusion.  No apparent arthropathy.

## 2018-10-17 ENCOUNTER — Encounter (HOSPITAL_COMMUNITY): Payer: Self-pay

## 2018-10-17 ENCOUNTER — Ambulatory Visit (HOSPITAL_COMMUNITY)
Admission: EM | Admit: 2018-10-17 | Discharge: 2018-10-17 | Disposition: A | Discharge status: Home / Self Care | Payer: Medicaid Other | Attending: Family Medicine | Admitting: Family Medicine

## 2018-10-17 DIAGNOSIS — R102 Pelvic and perineal pain: Secondary | ICD-10-CM

## 2018-10-17 DIAGNOSIS — R0781 Pleurodynia: Secondary | ICD-10-CM | POA: Diagnosis not present
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
## 2018-10-17 NOTE — Discharge Instructions (Signed)
Please take tylenol for the rib pain  Please follow up if your symptoms do not improve  Please be seen in the emergency department if you become short of breath or have a hard time breathing   [facility]

## 2020-03-13 ENCOUNTER — Ambulatory Visit: Payer: Self-pay

## 2022-02-21 ENCOUNTER — Encounter (HOSPITAL_COMMUNITY): Payer: Self-pay | Admitting: Emergency Medicine

## 2022-02-21 ENCOUNTER — Other Ambulatory Visit: Payer: Self-pay

## 2022-02-21 ENCOUNTER — Emergency Department (HOSPITAL_COMMUNITY)
Admission: EM | Admit: 2022-02-21 | Discharge: 2022-02-21 | Disposition: A | Discharge status: Home / Self Care | Payer: Medicaid Other | Attending: Emergency Medicine | Admitting: Emergency Medicine

## 2022-02-21 DIAGNOSIS — R112 Nausea with vomiting, unspecified: Secondary | ICD-10-CM | POA: Diagnosis present

## 2022-02-21 DIAGNOSIS — A059 Bacterial foodborne intoxication, unspecified: Secondary | ICD-10-CM | POA: Diagnosis not present

## 2022-02-21 LAB — COMPREHENSIVE METABOLIC PANEL
ALT: 18 U/L (ref 0–44)
AST: 22 U/L (ref 15–41)
Albumin: 3.7 g/dL (ref 3.5–5.0)
Alkaline Phosphatase: 28 U/L — ABNORMAL LOW (ref 38–126)
Anion gap: 5 (ref 5–15)
BUN: 14 mg/dL (ref 6–20)
CO2: 27 mmol/L (ref 22–32)
Calcium: 8.9 mg/dL (ref 8.9–10.3)
Chloride: 106 mmol/L (ref 98–111)
Creatinine, Ser: 0.89 mg/dL (ref 0.44–1.00)
GFR, Estimated: >60 mL/min (ref >60)
Glucose, Bld: 105 mg/dL — ABNORMAL HIGH (ref 70–99)
Potassium: 3.6 mmol/L (ref 3.5–5.1)
Sodium: 138 mmol/L (ref 135–145)
Total Bilirubin: 0.6 mg/dL (ref 0.3–1.2)
Total Protein: 5.7 g/dL — ABNORMAL LOW (ref 6.5–8.1)

## 2022-02-21 LAB — CBC
HCT: 37.1 % (ref 36.0–46.0)
Hemoglobin: 12.3 g/dL (ref 12.0–15.0)
MCH: 29.2 pg (ref 26.0–34.0)
MCHC: 33.2 g/dL (ref 30.0–36.0)
MCV: 88.1 fL (ref 80.0–100.0)
Platelets: 231 10*3/uL (ref 150–400)
RBC: 4.21 MIL/uL (ref 3.87–5.11)
RDW: 14.4 % (ref 11.5–15.5)
WBC: 5.4 10*3/uL (ref 4.0–10.5)
nRBC: 0 % (ref 0.0–0.2)

## 2022-02-21 LAB — LIPASE, BLOOD: Lipase: 27 U/L (ref 11–51)

## 2022-02-21 LAB — I-STAT BETA HCG BLOOD, ED (MC, WL, AP ONLY): I-stat hCG, quantitative: <5 m[IU]/mL (ref <5)

## 2022-02-21 MED ORDER — SODIUM CHLORIDE 0.9 % IV BOLUS
1000.0000 mL | Freq: Once | INTRAVENOUS | Status: AC
  Administered 2022-02-21: 1000 mL via INTRAVENOUS

## 2022-02-21 MED ORDER — ONDANSETRON HCL 4 MG PO TABS: 4.0000 mg | ORAL_TABLET | Freq: Four times a day (QID) | ORAL | 0 refills | Status: DC

## 2022-02-21 MED ORDER — ONDANSETRON HCL 4 MG/2ML IJ SOLN
4.0000 mg | Freq: Once | INTRAMUSCULAR | Status: AC
  Administered 2022-02-21: 4 mg via INTRAVENOUS
  Filled 2022-02-21: qty 2

## 2022-02-21 NOTE — ED Provider Notes (Signed)
?[facility] EMERGENCY DEPARTMENT ?Provider Note ? ? ?CSN: [id number] ?Arrival date & time: 02/21/22  1704 ? ?  ? ?History ? ?Chief Complaint  ?Patient presents with  ? Abdominal Pain  ? Emesis  ? Diarrhea  ? ? ?Amber Bernard is a 36 y.o. female with past medical history who presents with concern for food poisoning, nausea, vomiting, diarrhea since last night.  Patient reports that her and family were eating at [place] buffet, and all had symptoms within around 3 hours of eating.  She denies any hematemesis, hematochezia.  She reports that she has not had any sharp piercing abdominal pain.  She reports a history of appendectomy, no other intra-abdominal surgery.  She has not taken anything at home for her symptoms.  She is concerned that she has not been able to keep anything down including food or fluids. ? ? ?Abdominal Pain ?Associated symptoms: diarrhea and vomiting   ?Emesis ?Associated symptoms: abdominal pain and diarrhea   ?Diarrhea ?Associated symptoms: abdominal pain and vomiting   ? ?  ? ?Home Medications ?Prior to Admission medications   ?Medication Sig Start Date End Date Taking? Authorizing Provider  ?ondansetron (ZOFRAN) 4 MG tablet Take 1 tablet (4 mg total) by mouth every 6 (six) hours. 02/21/22  Yes Ena Demary H, PA-C  ?hydrOXYzine (ATARAX/VISTARIL) 25 MG tablet Take 1 tablet (25 mg total) by mouth 3 (three) times daily as needed for anxiety. 09/08/16   Withrow, Everardo All, FNP  ?   ? ?Allergies    ?Pine needle [fir needle oil, canadian-balsam]; Tomasa Blase flavor; and Sausage [pickled meat]   ? ?Review of Systems   ?Review of Systems  ?Gastrointestinal:  Positive for abdominal pain, diarrhea and vomiting.  ?All other systems reviewed and are negative. ? ?Physical Exam ?Updated Vital Signs ?BP 113/74 (BP Location: Right Arm)   Pulse (!) 52   Temp 98.1 ?F (36.7 ?C) (Oral)   Resp 14   LMP 02/15/2022 (Approximate)   SpO2 100%  ?Physical Exam ?Vitals and nursing note reviewed.   ?Constitutional:   ?   General: She is not in acute distress. ?   Appearance: Normal appearance.  ?HENT:  ?   Head: Normocephalic and atraumatic.  ?   Mouth/Throat:  ?   Mouth: Mucous membranes are dry.  ?Eyes:  ?   General:     ?   Right eye: No discharge.     ?   Left eye: No discharge.  ?Cardiovascular:  ?   Rate and Rhythm: Normal rate and regular rhythm.  ?   Heart sounds: No murmur heard. ?  No friction rub. No gallop.  ?Pulmonary:  ?   Effort: Pulmonary effort is normal.  ?   Breath sounds: Normal breath sounds.  ?Abdominal:  ?   General: Bowel sounds are normal.  ?   Palpations: Abdomen is soft.  ?   Comments: Minimal generalized versus epigastric tenderness to palpation.  No rebound, rigidity, guarding throughout.  Normal bowel sounds throughout.  ?Skin: ?   General: Skin is warm and dry.  ?   Capillary Refill: Capillary refill takes less than 2 seconds.  ?Neurological:  ?   Mental Status: She is alert and oriented to person, place, and time.  ?Psychiatric:     ?   Mood and Affect: Mood normal.     ?   Behavior: Behavior normal.  ? ? ?ED Results / Procedures / Treatments   ?Labs ?(all labs ordered are listed, but  only abnormal results are displayed) ?Labs Reviewed  ?COMPREHENSIVE METABOLIC PANEL - Abnormal; Notable for the following components:  ?    Result Value  ? Glucose, Bld 105 (*)   ? Total Protein 5.7 (*)   ? Alkaline Phosphatase 28 (*)   ? All other components within normal limits  ?LIPASE, BLOOD  ?CBC  ?URINALYSIS, ROUTINE W REFLEX MICROSCOPIC  ?I-STAT BETA HCG BLOOD, ED ([facility], [facility], [facility] ONLY)  ? ? ?EKG ?None ? ?Radiology ?No results found. ? ?Procedures ?Procedures  ? ? ?Medications Ordered in ED ?Medications  ?sodium chloride 0.9 % bolus 1,000 mL (0 mLs Intravenous Stopped 02/21/22 1954)  ?ondansetron [facility]) injection 4 mg (4 mg Intravenous Given 02/21/22 1829)  ? ? ?ED Course/ Medical Decision Making/ A&P ?Clinical Course as of 02/21/22 2029  ?Sun Feb 21, 2022  ?1747 Food poisoning -- getting fluids  and zofran, plan to DC [CP]  ?  ?Clinical Course User Index ?[CP] Audie Wieser H, PA-C  ? ?                        ?Medical Decision Making ?Amount and/or Complexity of Data Reviewed ?Labs: ordered. ? ?Risk ?Prescription drug management. ? ? ?This is a 36 year old female with past medical history of appendectomy who presents with concern for nausea, vomiting, diarrhea for the last 3 to 4 hours.  She is concerned about food poisoning.  Clinically her history is consistent with food poisoning, especially as to other members of the household are presenting with similar symptoms.  We will check basic lab work to assess for electrolyte abnormalities.  Will provide Zofran, fluid bolus.  On reassessment patient feels improved.  Patient stable for discharge at this time, will discharge with Zofran prescription.  Encouraged bland diet until symptoms resolve, discussed that symptoms likely resolved within around 48 hours.  Extensive return precautions given.  Patient discharged in stable condition at this time. ?Final Clinical Impression(s) / ED Diagnoses ?Final diagnoses:  ?Food poisoning  ? ? ?Rx / DC Orders ?ED Discharge Orders   ? ?      Ordered  ?  ondansetron (ZOFRAN) 4 MG tablet  Every 6 hours       ? 02/21/22 2028  ? ?  ?  ? ?  ? ? ?  ?Olene Floss, PA-C ?02/21/22 2029 ? ?  ?Sloan Leiter, DO ?02/22/22 0121 ? ?

## 2022-02-21 NOTE — ED Triage Notes (Signed)
C/o generalized abd pain, nausea, vomiting, and diarrhea since eating at [employer] last night. ?

## 2022-02-21 NOTE — Discharge Instructions (Signed)
Recommend that you drink plenty of fluids, you can take the Zofran every 6-8 hours as needed for any persistent nausea, I expect that your symptoms will resolve in the next 48 to 72 hours.  You may want to stick to a gentle diet until all of your symptoms have resolved.  Please return to the emergency department if you have concern for worsening abdominal pain, nausea, vomiting. ?

## 2024-03-28 ENCOUNTER — Encounter (HOSPITAL_COMMUNITY): Payer: Self-pay | Admitting: Emergency Medicine

## 2024-03-28 ENCOUNTER — Other Ambulatory Visit: Payer: Self-pay

## 2024-03-28 ENCOUNTER — Emergency Department (HOSPITAL_COMMUNITY)
Admission: EM | Admit: 2024-03-28 | Discharge: 2024-03-28 | Disposition: A | Discharge status: Home / Self Care | Payer: Medicaid - Out of State | Attending: Emergency Medicine | Admitting: Emergency Medicine

## 2024-03-28 DIAGNOSIS — M791 Myalgia, unspecified site: Secondary | ICD-10-CM

## 2024-03-28 DIAGNOSIS — E162 Hypoglycemia, unspecified: Secondary | ICD-10-CM

## 2024-03-28 DIAGNOSIS — J45909 Unspecified asthma, uncomplicated: Secondary | ICD-10-CM | POA: Insufficient documentation

## 2024-03-28 LAB — CBC WITH DIFFERENTIAL/PLATELET
Abs Immature Granulocytes: 0.08 10*3/uL — ABNORMAL HIGH (ref 0.00–0.07)
Basophils Absolute: 0.1 10*3/uL (ref 0.0–0.1)
Basophils Relative: 0 %
Eosinophils Absolute: 0 10*3/uL (ref 0.0–0.5)
Eosinophils Relative: 0 %
HCT: 38.6 % (ref 36.0–46.0)
Hemoglobin: 12.2 g/dL (ref 12.0–15.0)
Immature Granulocytes: 1 %
Lymphocytes Relative: 9 %
Lymphs Abs: 1.4 10*3/uL (ref 0.7–4.0)
MCH: 29.1 pg (ref 26.0–34.0)
MCHC: 31.6 g/dL (ref 30.0–36.0)
MCV: 92.1 fL (ref 80.0–100.0)
Monocytes Absolute: 1 10*3/uL (ref 0.1–1.0)
Monocytes Relative: 6 %
Neutro Abs: 13.4 10*3/uL — ABNORMAL HIGH (ref 1.7–7.7)
Neutrophils Relative %: 84 %
Platelets: 227 10*3/uL (ref 150–400)
RBC: 4.19 MIL/uL (ref 3.87–5.11)
RDW: 15.1 % (ref 11.5–15.5)
WBC: 16 10*3/uL — ABNORMAL HIGH (ref 4.0–10.5)
nRBC: 0 % (ref 0.0–0.2)

## 2024-03-28 LAB — COMPREHENSIVE METABOLIC PANEL WITH GFR
ALT: 23 U/L (ref 0–44)
AST: 25 U/L (ref 15–41)
Albumin: 3.7 g/dL (ref 3.5–5.0)
Alkaline Phosphatase: 28 U/L — ABNORMAL LOW (ref 38–126)
Anion gap: 13 (ref 5–15)
BUN: 12 mg/dL (ref 6–20)
CO2: 16 mmol/L — ABNORMAL LOW (ref 22–32)
Calcium: 8.5 mg/dL — ABNORMAL LOW (ref 8.9–10.3)
Chloride: 111 mmol/L (ref 98–111)
Creatinine, Ser: 0.69 mg/dL (ref 0.44–1.00)
GFR, Estimated: >60 mL/min (ref >60)
Glucose, Bld: 66 mg/dL — ABNORMAL LOW (ref 70–99)
Potassium: 3.6 mmol/L (ref 3.5–5.1)
Sodium: 140 mmol/L (ref 135–145)
Total Bilirubin: 0.6 mg/dL (ref 0.0–1.2)
Total Protein: 6.1 g/dL — ABNORMAL LOW (ref 6.5–8.1)

## 2024-03-28 LAB — ETHANOL: Alcohol, Ethyl (B): 77 mg/dL — ABNORMAL HIGH (ref <15)

## 2024-03-28 MED ORDER — DEXTROSE 50 % IV SOLN
12.5000 g | Freq: Once | INTRAVENOUS | Status: AC
  Administered 2024-03-28: 12.5 g via INTRAVENOUS
  Filled 2024-03-28: qty 50

## 2024-03-28 MED ORDER — ONDANSETRON HCL 4 MG/2ML IJ SOLN
4.0000 mg | Freq: Once | INTRAMUSCULAR | Status: AC
  Administered 2024-03-28: 4 mg via INTRAVENOUS
  Filled 2024-03-28: qty 2

## 2024-03-28 MED ORDER — SODIUM CHLORIDE 0.9 % IV BOLUS
1000.0000 mL | Freq: Once | INTRAVENOUS | Status: AC
  Administered 2024-03-28: 1000 mL via INTRAVENOUS

## 2024-03-28 MED ORDER — KETOROLAC TROMETHAMINE 30 MG/ML IJ SOLN
30.0000 mg | Freq: Once | INTRAMUSCULAR | Status: AC
  Administered 2024-03-28: 30 mg via INTRAVENOUS
  Filled 2024-03-28: qty 1
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
## 2024-03-28 NOTE — ED Notes (Signed)
 Pt given OJ and tolerated po challenge w/out any difficulties.

## 2024-03-28 NOTE — ED Provider Notes (Signed)
 [place] EMERGENCY DEPARTMENT AT [facility] Provider Note   CSN: [id number] Arrival date & time: 03/28/24  0433     History  Chief Complaint  Patient presents with   Sickle Cell Pain Crisis   Alcohol Intoxication    Amber Bernard is a 38 y.o. female.  The history is provided by the patient and the police.  Sickle Cell Pain Crisis Alcohol Intoxication  She has history asthma, depression, attention deficit disorder, posttraumatic stress disorder, sickle cell trait and was brought in by police because he was in jail and started complaining of hurting everywhere and started vomiting.  Please report alcohol intoxication, patient denies alcohol or drug use.  She denies fever or chills.  She has not taken anything for pain.   Home Medications Prior to Admission medications   Medication Sig Start Date End Date Taking? Authorizing Provider  hydrOXYzine  (ATARAX /VISTARIL ) 25 MG tablet Take 1 tablet (25 mg total) by mouth 3 (three) times daily as needed for anxiety. 09/08/16   Withrow, Theadora Fines, FNP  ondansetron  (ZOFRAN ) 4 MG tablet Take 1 tablet (4 mg total) by mouth every 6 (six) hours. 02/21/22   Prosperi, Christian H, PA-C      Allergies    Pine needle [fir needle oil, canadian-balsam]; Bacon flavoring agent (non-screening); and Sausage [pickled meat]    Review of Systems   Review of Systems  All other systems reviewed and are negative.   Physical Exam Updated Vital Signs BP 130/61   Pulse 85   Temp 98.3 F (36.8 C)   Resp 15   Ht 5\' 6"  (1.676 m)   Wt 54 kg   SpO2 100%   BMI 19.21 kg/m  Physical Exam Vitals and nursing note reviewed.   38 year old female, resting comfortably and in no acute distress. Vital signs are normal. Oxygen saturation is 100%, which is normal. Head is normocephalic and atraumatic. PERRLA, EOMI.  Neck is nontender and supple. Back is nontender. Lungs are clear without rales, wheezes, or rhonchi. Chest is nontender. Heart has  regular rate and rhythm without murmur. Abdomen is soft, flat, nontender. Extremities have no cyanosis or edema, full range of motion is present. Skin is warm and dry without rash. Neurologic: Awake but slow to respond to questions and speech slightly slurred consistent with alcohol intoxication.  Moves all extremities equally.  ED Results / Procedures / Treatments   Labs (all labs ordered are listed, but only abnormal results are displayed) Labs Reviewed  COMPREHENSIVE METABOLIC PANEL WITH GFR - Abnormal; Notable for the following components:      Result Value   CO2 16 (*)    Glucose, Bld 66 (*)    Calcium 8.5 (*)    Total Protein 6.1 (*)    Alkaline Phosphatase 28 (*)    All other components within normal limits  ETHANOL - Abnormal; Notable for the following components:   Alcohol, Ethyl (B) 77 (*)    All other components within normal limits  CBC WITH DIFFERENTIAL/PLATELET - Abnormal; Notable for the following components:   WBC 16.0 (*)    Neutro Abs 13.4 (*)    Abs Immature Granulocytes 0.08 (*)    All other components within normal limits   Procedures Procedures  Cardiac monitor shows normal sinus rhythm, per my interpretation.  Medications Ordered in ED Medications  dextrose  50 % solution 12.5 g (has no administration in time range)  sodium chloride  0.9 % bolus 1,000 mL (1,000 mLs Intravenous New Bag/Given  03/28/24 0511)  ondansetron  (ZOFRAN ) injection 4 mg (4 mg Intravenous Given 03/28/24 0512)  ketorolac (TORADOL) 30 MG/ML injection 30 mg (30 mg Intravenous Given 03/28/24 4098)    ED Course/ Medical Decision Making/ A&P                                 Medical Decision Making Amount and/or Complexity of Data Reviewed Labs: ordered.  Risk Prescription drug management.   Vomiting, generalized pain in patient with sickle cell trait.  She states that this is a sickle cell crisis, but her diagnosis is sickle cell trait and not sickle cell disease.  I have ordered  screening labs of CBC, comprehensive metabolic panel, ethanol level.  I have ordered IV fluids, ondansetron  for nausea, ketorolac for pain.  I have reviewed her past records, and see no visits for sickle cell crisis.  Following above-noted treatment, she has not had any more episodes of vomiting.  I have reviewed her laboratory tests, and my interpretation is borderline hypoglycemia, low CO2 but with normal anion gap, mild leukocytosis which is nonspecific.  Ethanol level is below the level of legal intoxication.  I ordered dextrose  intravenously and ordered some food for the patient.  She is discharged into police custody.  Final Clinical Impression(s) / ED Diagnoses Final diagnoses:  Myalgia  Hypoglycemia    Rx / DC Orders ED Discharge Orders     None         Alissa April, MD 03/28/24 0700

## 2024-03-28 NOTE — Discharge Instructions (Addendum)
 You may take acetaminophen  and/or ibuprofen  as needed for pain.

## 2024-05-12 ENCOUNTER — Other Ambulatory Visit: Payer: Self-pay

## 2024-05-12 ENCOUNTER — Encounter (HOSPITAL_COMMUNITY): Payer: Self-pay | Admitting: *Deleted

## 2024-05-12 ENCOUNTER — Ambulatory Visit (HOSPITAL_COMMUNITY)
Admission: EM | Admit: 2024-05-12 | Discharge: 2024-05-12 | Disposition: A | Discharge status: Home / Self Care | Payer: Self-pay | Attending: Emergency Medicine | Admitting: Emergency Medicine

## 2024-05-12 DIAGNOSIS — M5442 Lumbago with sciatica, left side: Secondary | ICD-10-CM

## 2024-05-12 DIAGNOSIS — J069 Acute upper respiratory infection, unspecified: Secondary | ICD-10-CM

## 2024-05-12 LAB — POC SARS CORONAVIRUS 2 AG -  ED: SARS Coronavirus 2 Ag: NEGATIVE

## 2024-05-12 MED ORDER — PROMETHAZINE-DM 6.25-15 MG/5ML PO SYRP: 5.0000 mL | ORAL_SOLUTION | Freq: Every evening | ORAL | 0 refills | Status: DC | PRN

## 2024-05-12 MED ORDER — AZELASTINE HCL 0.1 % NA SOLN: 2.0000 | Freq: Two times a day (BID) | NASAL | 0 refills | Status: AC

## 2024-05-12 MED ORDER — NAPROXEN 500 MG PO TABS: 500.0000 mg | ORAL_TABLET | Freq: Two times a day (BID) | ORAL | 0 refills | Status: DC

## 2024-05-12 MED ORDER — BENZONATATE 100 MG PO CAPS: 100.0000 mg | ORAL_CAPSULE | Freq: Three times a day (TID) | ORAL | 0 refills | Status: DC

## 2024-05-12 NOTE — ED Provider Notes (Signed)
 [facility]    CSN: [id number] Arrival date & time: 05/12/24  1005      History   Chief Complaint Chief Complaint  Patient presents with   Epistaxis   Leg Pain   Muscle Pain   Cough    HPI Amber Bernard is a 38 y.o. female.   Patient presents with productive cough and congestion x 3 days.  Patient states that when she coughs she does have some pain to her chest.  Patient states that when she blows her nose she occasionally has some blood mixed in this as well.    Denies fever, body aches, chills, headache, sore throat, nausea, vomiting, diarrhea, and abdominal pain.  Patient does report history of asthma.  Patient states that she has not had to use her inhaler in a very long time.  Patient denies any recent shortness of breath or wheezing.  Patient denies any known sick exposures.  Patient also reports bilateral low back pain that radiates down her left leg.  Patient denies any history of sciatic.  Denies urinary symptoms.  Patient denies any recent falls or known injury.  Patient denies taking any medication for her symptoms.  The history is provided by the patient and medical records.  Epistaxis Associated symptoms: cough   Leg Pain Muscle Pain  Cough   Past Medical History:  Diagnosis Date   ADD (attention deficit disorder)    ADHD (attention deficit hyperactivity disorder)    Anxiety    Asthma    MDD (major depressive disorder)    PTSD (post-traumatic stress disorder)    Sickle cell trait [facility])     Patient Active Problem List   Diagnosis Date Noted   Major depressive disorder, recurrent episode, moderate (HCC) 09/08/2016   Abnormal urinalysis     Past Surgical History:  Procedure Laterality Date   APPENDECTOMY      OB History   No obstetric history on file.      Home Medications    Prior to Admission medications   Medication Sig Start Date End Date Taking? Authorizing Provider  azelastine (ASTELIN) 0.1 % nasal spray Place 2  sprays into both nostrils 2 (two) times daily. Use in each nostril as directed 05/12/24  Yes Johnie Flaming A, NP  benzonatate  (TESSALON ) 100 MG capsule Take 1 capsule (100 mg total) by mouth every 8 (eight) hours. 05/12/24  Yes Johnie, Juriel Cid A, NP  naproxen (NAPROSYN) 500 MG tablet Take 1 tablet (500 mg total) by mouth 2 (two) times daily. 05/12/24  Yes Johnie Flaming A, NP  promethazine-dextromethorphan (PROMETHAZINE-DM) 6.25-15 MG/5ML syrup Take 5 mLs by mouth at bedtime as needed for cough. 05/12/24  Yes Johnie Flaming LABOR, NP    Family History History reviewed. No pertinent family history.  Social History Social History   Tobacco Use   Smoking status: Every Day    Types: Cigarettes   Smokeless tobacco: Never  Vaping Use   Vaping status: Never Used  Substance Use Topics   Alcohol use: No   Drug use: Yes    Types: Marijuana     Allergies   Pine needle [fir needle oil, canadian-balsam]; Bacon flavoring agent (non-screening); and Sausage [pickled meat]   Review of Systems Review of Systems  HENT:  Positive for nosebleeds.   Respiratory:  Positive for cough.    Per HPI  Physical Exam Triage Vital Signs ED Triage Vitals  Encounter Vitals Group     BP 05/12/24 1031 126/64  Girls Systolic BP Percentile --      Girls Diastolic BP Percentile --      Boys Systolic BP Percentile --      Boys Diastolic BP Percentile --      Pulse Rate 05/12/24 1031 63     Resp 05/12/24 1031 20     Temp 05/12/24 1031 98.2 F (36.8 C)     Temp src --      SpO2 05/12/24 1031 97 %     Weight --      Height --      Head Circumference --      Peak Flow --      Pain Score 05/12/24 1029 7     Pain Loc --      Pain Education --      Exclude from Growth Chart --    No data found.  Updated Vital Signs BP 126/64   Pulse 63   Temp 98.2 F (36.8 C)   Resp 20   LMP 04/21/2024 (Approximate)   SpO2 97%   Visual Acuity Right Eye Distance:   Left Eye Distance:   Bilateral  Distance:    Right Eye Near:   Left Eye Near:    Bilateral Near:     Physical Exam Vitals and nursing note reviewed.  Constitutional:      General: She is awake. She is not in acute distress.    Appearance: Normal appearance. She is well-developed and well-groomed. She is not ill-appearing.  HENT:     Right Ear: Tympanic membrane, ear canal and external ear normal.     Left Ear: Tympanic membrane, ear canal and external ear normal.     Nose: Congestion and rhinorrhea present.     Mouth/Throat:     Mouth: Mucous membranes are moist.     Pharynx: Posterior oropharyngeal erythema and postnasal drip present. No oropharyngeal exudate.   Cardiovascular:     Rate and Rhythm: Normal rate and regular rhythm.  Pulmonary:     Effort: Pulmonary effort is normal.     Breath sounds: Normal breath sounds.   Musculoskeletal:     Cervical back: Normal.     Thoracic back: Normal.     Lumbar back: Tenderness present. No swelling, edema, deformity, signs of trauma or bony tenderness. Normal range of motion. Positive left straight leg raise test. Negative right straight leg raise test.   Skin:    General: Skin is warm and dry.   Neurological:     Mental Status: She is alert.   Psychiatric:        Behavior: Behavior is cooperative.      UC Treatments / Results  Labs (all labs ordered are listed, but only abnormal results are displayed) Labs Reviewed  POC SARS CORONAVIRUS 2 AG -  ED    EKG   Radiology No results found.  Procedures Procedures (including critical care time)  Medications Ordered in UC Medications - No data to display  Initial Impression / Assessment and Plan / UC Course  I have reviewed the triage vital signs and the nursing notes.  Pertinent labs & imaging results that were available during my care of the patient were reviewed by me and considered in my medical decision making (see chart for details).     Patient is overall well-appearing.  Vitals are  stable.  Upon assessment mild congestion and rhinorrhea are present, mild erythema and PND noted to pharynx.  Lungs clear bilaterally to auscultation.  Patient also  has tenderness noted to bilateral low back with positive left straight leg raise test.  COVID testing negative.  Symptoms likely viral in nature.  Prescribed Tessalon  and Promethazine DM as needed for cough.  Prescribed azelastine nasal spray to help with congestion.  Discussed over-the-counter medication for additional symptomatic relief.  Prescribe naproxen for low back pain with sciatica.  Recommended taking Tylenol  as needed for breakthrough pain.  Given orthopedic follow-up if needed.  Discussed follow-up and return precautions Final Clinical Impressions(s) / UC Diagnoses   Final diagnoses:  Acute bilateral low back pain with left-sided sciatica  Viral URI with cough     Discharge Instructions      Your COVID test was negative.  I believe your symptoms are likely related to a viral respiratory illness.   You can take Tessalon  every 8 hours as needed for cough. You can take Promethazine DM cough syrup at bedtime as needed for cough.  This can make you drowsy so do not drive, work, or drink alcohol while taking this Use azelastine nasal spray twice daily to help with congestion. Otherwise you can take over-the-counter Mucinex to help with cough and congestion.  Take naproxen twice daily as needed for back and leg pain.  Do not take this with other NSAIDs including ibuprofen , Advil , Motrin , and Aleve. You can take 650 mg of Tylenol  every 6-8 hours as needed for breakthrough pain. You can also apply heat to your back and leg as needed for additional pain relief. I have attached [facility] that you can follow-up with if your back and leg pain continue. Follow-up with your primary care provider or return here as needed.     ED Prescriptions     Medication Sig Dispense Auth. Provider   naproxen (NAPROSYN)  500 MG tablet Take 1 tablet (500 mg total) by mouth 2 (two) times daily. 30 tablet Johnie Flaming A, NP   benzonatate  (TESSALON ) 100 MG capsule Take 1 capsule (100 mg total) by mouth every 8 (eight) hours. 21 capsule Johnie Flaming A, NP   promethazine-dextromethorphan (PROMETHAZINE-DM) 6.25-15 MG/5ML syrup Take 5 mLs by mouth at bedtime as needed for cough. 118 mL Johnie, Ankith Edmonston A, NP   azelastine (ASTELIN) 0.1 % nasal spray Place 2 sprays into both nostrils 2 (two) times daily. Use in each nostril as directed 30 mL Johnie Flaming A, NP      PDMP not reviewed this encounter.   Johnie Flaming A, NP 05/12/24 1113

## 2024-05-12 NOTE — ED Triage Notes (Signed)
 PT reports a nose bleed whenever they blow nose. Pt also has a cough and muscles huret when coughing. Pt also has Lt leg pain. All Sx's for several days .

## 2024-05-12 NOTE — Discharge Instructions (Addendum)
 Your COVID test was negative.  I believe your symptoms are likely related to a viral respiratory illness.   You can take Tessalon  every 8 hours as needed for cough. You can take Promethazine DM cough syrup at bedtime as needed for cough.  This can make you drowsy so do not drive, work, or drink alcohol while taking this Use azelastine nasal spray twice daily to help with congestion. Otherwise you can take over-the-counter Mucinex to help with cough and congestion.  Take naproxen twice daily as needed for back and leg pain.  Do not take this with other NSAIDs including ibuprofen , Advil , Motrin , and Aleve. You can take 650 mg of Tylenol  every 6-8 hours as needed for breakthrough pain. You can also apply heat to your back and leg as needed for additional pain relief. I have attached [facility] that you can follow-up with if your back and leg pain continue. Follow-up with your primary care provider or return here as needed.

## 2024-05-18 ENCOUNTER — Emergency Department (HOSPITAL_BASED_OUTPATIENT_CLINIC_OR_DEPARTMENT_OTHER)
Admission: EM | Admit: 2024-05-18 | Discharge: 2024-05-18 | Disposition: A | Discharge status: Home / Self Care | Attending: Emergency Medicine | Admitting: Emergency Medicine

## 2024-05-18 ENCOUNTER — Encounter (HOSPITAL_BASED_OUTPATIENT_CLINIC_OR_DEPARTMENT_OTHER): Payer: Self-pay

## 2024-05-18 ENCOUNTER — Emergency Department (HOSPITAL_BASED_OUTPATIENT_CLINIC_OR_DEPARTMENT_OTHER)

## 2024-05-18 ENCOUNTER — Other Ambulatory Visit: Payer: Self-pay

## 2024-05-18 DIAGNOSIS — W010XXA Fall on same level from slipping, tripping and stumbling without subsequent striking against object, initial encounter: Secondary | ICD-10-CM | POA: Diagnosis not present

## 2024-05-18 DIAGNOSIS — J45909 Unspecified asthma, uncomplicated: Secondary | ICD-10-CM | POA: Diagnosis not present

## 2024-05-18 DIAGNOSIS — S93401A Sprain of unspecified ligament of right ankle, initial encounter: Secondary | ICD-10-CM | POA: Diagnosis not present

## 2024-05-18 DIAGNOSIS — Y92410 Unspecified street and highway as the place of occurrence of the external cause: Secondary | ICD-10-CM | POA: Diagnosis not present

## 2024-05-18 DIAGNOSIS — M25571 Pain in right ankle and joints of right foot: Secondary | ICD-10-CM | POA: Diagnosis present

## 2024-05-18 DIAGNOSIS — Y9302 Activity, running: Secondary | ICD-10-CM | POA: Insufficient documentation

## 2024-05-18 LAB — PREGNANCY, URINE: Preg Test, Ur: NEGATIVE

## 2024-05-18 MED ORDER — OXYCODONE-ACETAMINOPHEN 5-325 MG PO TABS
1.0000 | ORAL_TABLET | Freq: Once | ORAL | Status: AC
  Administered 2024-05-18: 1 via ORAL
  Filled 2024-05-18: qty 1

## 2024-05-18 NOTE — ED Triage Notes (Signed)
 Brought in by [facility] for eval of right ankle/foot pain and bilateral knee abrasions sec to being chased by a large dog. No dog bite.  EMS vitals: 112/78 HR 98 SPO2 99% RR 20

## 2024-05-18 NOTE — Discharge Instructions (Addendum)
 Please follow-up with your primary care provider.  You may also follow-up with orthopedics if your symptoms or not resolving appropriately in the next 7 days.  Seek emergency care if experiencing any new or worsening symptoms.  Alternating between 650 mg Tylenol  and 400 mg Advil : The best way to alternate taking Acetaminophen  (example Tylenol ) and Ibuprofen  (example Advil /Motrin ) is to take them 3 hours apart. For example, if you take ibuprofen  at 6 am you can then take Tylenol  at 9 am. You can continue this regimen throughout the day, making sure you do not exceed the recommended maximum dose for each drug.

## 2024-05-18 NOTE — ED Provider Notes (Signed)
 [place] [facility] AT [facility] Provider Note   CSN: [id number] Arrival date & time: 05/18/24  1232     Patient presents with: Ankle Pain   Amber Bernard is a 38 y.o. female with  PMHX asthma who presents to ED concerned for right ankle pain. Patient stating that she was running from a dog when they tripped and fell on the median on a street. Patient has scattered abrasions on knees, but states that they are only concerned for possible fracture in ankle/foot. No other symptoms today. Patient was not bit by the dog.     Ankle Pain      Prior to Admission medications   Medication Sig Start Date End Date Taking? Authorizing Provider  azelastine  (ASTELIN ) 0.1 % nasal spray Place 2 sprays into both nostrils 2 (two) times daily. Use in each nostril as directed 05/12/24   Johnie Flaming A, NP  benzonatate  (TESSALON ) 100 MG capsule Take 1 capsule (100 mg total) by mouth every 8 (eight) hours. 05/12/24   Johnie Flaming A, NP  naproxen  (NAPROSYN ) 500 MG tablet Take 1 tablet (500 mg total) by mouth 2 (two) times daily. 05/12/24   Johnie Flaming A, NP  promethazine -dextromethorphan (PROMETHAZINE -DM) 6.25-15 MG/5ML syrup Take 5 mLs by mouth at bedtime as needed for cough. 05/12/24   Johnie Flaming LABOR, NP    Allergies: Pine needle [fir needle oil, canadian-balsam]; Aldona flavoring agent (non-screening); and Sausage [pickled meat]    Review of Systems  Musculoskeletal:        Ankle pain    Updated Vital Signs BP (!) 131/99 (BP Location: Right Arm)   Pulse 80   Temp 97.7 F (36.5 C)   Resp 18   Ht 5' 6 (1.676 m)   Wt 54.4 kg   LMP 04/21/2024 (Approximate)   SpO2 100%   BMI 19.37 kg/m   Physical Exam Vitals and nursing note reviewed.  Constitutional:      General: She is not in acute distress.    Appearance: She is not ill-appearing or toxic-appearing.  HENT:     Head: Normocephalic and atraumatic.   Eyes:     General: No scleral icterus.        Right eye: No discharge.        Left eye: No discharge.     Conjunctiva/sclera: Conjunctivae normal.    Cardiovascular:     Rate and Rhythm: Normal rate.  Pulmonary:     Effort: Pulmonary effort is normal.  Abdominal:     General: Abdomen is flat.   Musculoskeletal:     Comments: Right ankle: ROM restricted d/t pain. +2 pedal pulse. Tenderness to palpation of ankle and dorsum of foot. No swelling or erythema appreciated. Area non-tense.   Skin:    General: Skin is warm and dry.   Neurological:     General: No focal deficit present.     Mental Status: She is alert. Mental status is at baseline.   Psychiatric:        Mood and Affect: Mood normal.        Behavior: Behavior normal.     (all labs ordered are listed, but only abnormal results are displayed) Labs Reviewed  PREGNANCY, URINE    EKG: None  Radiology: DG Ankle Complete Right Result Date: 05/18/2024 CLINICAL DATA:  fall.  Right ankle and foot pain. EXAM: RIGHT ANKLE - COMPLETE 3+ VIEW; RIGHT FOOT COMPLETE - 3+ VIEW COMPARISON:  None Available. FINDINGS: No acute fracture or dislocation.  No aggressive osseous lesion. Ankle mortise appears intact. No significant arthritis of imaged joints. No focal soft tissue swelling. No radiopaque foreign bodies. IMPRESSION: *No acute osseous abnormality of the right ankle or foot. Electronically Signed   By: Ree Molt M.D.   On: 05/18/2024 13:50   DG Foot Complete Right Result Date: 05/18/2024 CLINICAL DATA:  fall.  Right ankle and foot pain. EXAM: RIGHT ANKLE - COMPLETE 3+ VIEW; RIGHT FOOT COMPLETE - 3+ VIEW COMPARISON:  None Available. FINDINGS: No acute fracture or dislocation. No aggressive osseous lesion. Ankle mortise appears intact. No significant arthritis of imaged joints. No focal soft tissue swelling. No radiopaque foreign bodies. IMPRESSION: *No acute osseous abnormality of the right ankle or foot. Electronically Signed   By: Ree Molt M.D.   On: 05/18/2024  13:50     Procedures   Medications Ordered in the ED  oxyCODONE-acetaminophen  (PERCOCET/ROXICET) 5-325 MG per tablet 1 tablet (1 tablet Oral Given 05/18/24 1414)                                    Medical Decision Making Amount and/or Complexity of Data Reviewed Labs: ordered. Radiology: ordered.  Risk Prescription drug management.   This patient presents to the ED for concern of right ankle pain, this involves an extensive number of treatment options, and is a complaint that carries with it a high risk of complications and morbidity.  The differential diagnosis includes hemarthrosis, gout, septic joint, fracture, tendonitis, carpal tunnel syndrome, muscle strain, bursitis, compartment syndrome   Co morbidities that complicate the patient evaluation  asthma   Additional history obtained:  PCP with [facility]   Problem List / ED Course / Critical interventions / Medication management  Patient presents to ED concern for right ankle pain after mechanical fall.  Physical exam with tenderness to palpation of dorsum of right foot and ankle.  Rest physical exam reassuring.  Patient afebrile with stable vitals. I ordered imaging studies including ankle/foot x-ray. I independently visualized and interpreted imaging. I agree with the radiologist interpretation of no acute process.  Shared results with patient.  Answered all questions.  Provided patient with ankle brace and crutches.  Educated patient on alternating Advil  and Tylenol  for pain management.  Provided patient with orthopedic follow-up.  Patient also to follow-up with PCP. I have reviewed the patients home medicines and have made adjustments as needed The patient has been appropriately medically screened and/or stabilized in the ED. I have low suspicion for any other emergent medical condition which would require further screening, evaluation or treatment in the ED or require inpatient management. At  time of discharge the patient is hemodynamically stable and in no acute distress. I have discussed work-up results and diagnosis with patient and answered all questions. Patient is agreeable with discharge plan. We discussed strict return precautions for returning to the emergency department and they verbalized understanding.     Social Determinants of Health:  none      Final diagnoses:  Sprain of right ankle, unspecified ligament, initial encounter    ED Discharge Orders     None          Hoy Nidia FALCON, [place] 05/18/24 1426    Ellouise Richerd POUR, [place] 05/18/24 1530

## 2024-05-24 ENCOUNTER — Ambulatory Visit (HOSPITAL_COMMUNITY): Payer: Medicaid - Out of State | Admitting: Licensed Clinical Social Worker

## 2024-05-28 ENCOUNTER — Emergency Department (HOSPITAL_BASED_OUTPATIENT_CLINIC_OR_DEPARTMENT_OTHER)
Admission: EM | Admit: 2024-05-28 | Discharge: 2024-05-28 | Disposition: A | Discharge status: Home / Self Care | Payer: Self-pay | Attending: Emergency Medicine | Admitting: Emergency Medicine

## 2024-05-28 ENCOUNTER — Encounter (HOSPITAL_BASED_OUTPATIENT_CLINIC_OR_DEPARTMENT_OTHER): Payer: Self-pay | Admitting: *Deleted

## 2024-05-28 ENCOUNTER — Other Ambulatory Visit: Payer: Self-pay

## 2024-05-28 DIAGNOSIS — R112 Nausea with vomiting, unspecified: Secondary | ICD-10-CM | POA: Insufficient documentation

## 2024-05-28 DIAGNOSIS — R6883 Chills (without fever): Secondary | ICD-10-CM | POA: Insufficient documentation

## 2024-05-28 LAB — COMPREHENSIVE METABOLIC PANEL WITH GFR
ALT: 13 U/L (ref 0–44)
AST: 18 U/L (ref 15–41)
Albumin: 4.6 g/dL (ref 3.5–5.0)
Alkaline Phosphatase: 43 U/L (ref 38–126)
Anion gap: 17 — ABNORMAL HIGH (ref 5–15)
BUN: 13 mg/dL (ref 6–20)
CO2: 20 mmol/L — ABNORMAL LOW (ref 22–32)
Calcium: 8.9 mg/dL (ref 8.9–10.3)
Chloride: 103 mmol/L (ref 98–111)
Creatinine, Ser: 0.73 mg/dL (ref 0.44–1.00)
GFR, Estimated: >60 mL/min (ref >60)
Glucose, Bld: 90 mg/dL (ref 70–99)
Potassium: 3.4 mmol/L — ABNORMAL LOW (ref 3.5–5.1)
Sodium: 140 mmol/L (ref 135–145)
Total Bilirubin: 0.3 mg/dL (ref 0.0–1.2)
Total Protein: 7 g/dL (ref 6.5–8.1)

## 2024-05-28 LAB — CBC WITH DIFFERENTIAL/PLATELET
Abs Immature Granulocytes: 0.06 K/uL (ref 0.00–0.07)
Basophils Absolute: 0.1 K/uL (ref 0.0–0.1)
Basophils Relative: 1 %
Eosinophils Absolute: 0 K/uL (ref 0.0–0.5)
Eosinophils Relative: 0 %
HCT: 35.6 % — ABNORMAL LOW (ref 36.0–46.0)
Hemoglobin: 11.9 g/dL — ABNORMAL LOW (ref 12.0–15.0)
Immature Granulocytes: 0 %
Lymphocytes Relative: 6 %
Lymphs Abs: 0.8 K/uL (ref 0.7–4.0)
MCH: 28.8 pg (ref 26.0–34.0)
MCHC: 33.4 g/dL (ref 30.0–36.0)
MCV: 86.2 fL (ref 80.0–100.0)
Monocytes Absolute: 0.5 K/uL (ref 0.1–1.0)
Monocytes Relative: 4 %
Neutro Abs: 12 K/uL — ABNORMAL HIGH (ref 1.7–7.7)
Neutrophils Relative %: 89 %
Platelets: 250 K/uL (ref 150–400)
RBC: 4.13 MIL/uL (ref 3.87–5.11)
RDW: 14.2 % (ref 11.5–15.5)
WBC: 13.5 K/uL — ABNORMAL HIGH (ref 4.0–10.5)
nRBC: 0 % (ref 0.0–0.2)

## 2024-05-28 LAB — RESP PANEL BY RT-PCR (RSV, FLU A&B, COVID)  RVPGX2
Influenza A by PCR: NEGATIVE
Influenza B by PCR: NEGATIVE
Resp Syncytial Virus by PCR: NEGATIVE
SARS Coronavirus 2 by RT PCR: NEGATIVE

## 2024-05-28 LAB — HCG, SERUM, QUALITATIVE: Preg, Serum: NEGATIVE

## 2024-05-28 MED ORDER — ONDANSETRON 4 MG PO TBDP: 4.0000 mg | ORAL_TABLET | Freq: Three times a day (TID) | ORAL | 0 refills | Status: DC | PRN

## 2024-05-28 MED ORDER — ONDANSETRON HCL 4 MG/2ML IJ SOLN
4.0000 mg | Freq: Once | INTRAMUSCULAR | Status: AC
  Administered 2024-05-28: 4 mg via INTRAVENOUS
  Filled 2024-05-28: qty 2

## 2024-05-28 MED ORDER — LACTATED RINGERS IV BOLUS
1000.0000 mL | Freq: Once | INTRAVENOUS | Status: AC
  Administered 2024-05-28: 1000 mL via INTRAVENOUS

## 2024-05-28 NOTE — ED Triage Notes (Signed)
 Pt BIB mother with c/o vomiting started at 1 am.. Had something to drink - and once ingested it came back up. Have not been able to keep anything down since. Also has chills..Denies abd pain, shob or chest pain.

## 2024-05-28 NOTE — ED Provider Notes (Signed)
 [place] [facility]  Provider Note  CSN: [id number] Arrival date & time: 05/28/24 0436  History Chief Complaint  Patient presents with   Emesis    With chills this early this morning     Amber Bernard is a 38 y.o. female brought to the ED by mother for evaluation of nausea/vomiting onset about 0100hrs, felt fine earlier tonight but has not been able to keep anything down since. No abdominal pain or diarrhea. Felt some chills, but no fevers. No blood in emesis. No urinary symptoms. Does not think she is pregnant.    Home Medications Prior to Admission medications   Medication Sig Start Date End Date Taking? Authorizing Provider  ondansetron  (ZOFRAN -ODT) 4 MG disintegrating tablet Take 1 tablet (4 mg total) by mouth every 8 (eight) hours as needed for nausea or vomiting. 05/28/24  Yes Roselyn Carlin NOVAK, MD  azelastine  (ASTELIN ) 0.1 % nasal spray Place 2 sprays into both nostrils 2 (two) times daily. Use in each nostril as directed 05/12/24   Johnie Flaming A, NP  benzonatate  (TESSALON ) 100 MG capsule Take 1 capsule (100 mg total) by mouth every 8 (eight) hours. 05/12/24   Johnie Flaming A, NP  naproxen  (NAPROSYN ) 500 MG tablet Take 1 tablet (500 mg total) by mouth 2 (two) times daily. 05/12/24   Johnie Flaming A, NP  promethazine -dextromethorphan (PROMETHAZINE -DM) 6.25-15 MG/5ML syrup Take 5 mLs by mouth at bedtime as needed for cough. 05/12/24   Johnie Flaming LABOR, NP     Allergies    Pine needle [fir needle oil, canadian-balsam]; Aldona flavoring agent (non-screening); and Sausage [pickled meat]   Review of Systems   Review of Systems Please see HPI for pertinent positives and negatives  Physical Exam BP (!) 117/55 (BP Location: Left Arm)   Pulse 62   Temp 97.9 F (36.6 C) (Oral)   Resp 16   Ht 5' 6 (1.676 m)   Wt 63.5 kg   LMP 05/23/2024 (Exact Date)   SpO2 99%   BMI 22.60 kg/m   Physical Exam Vitals and nursing note  reviewed.  Constitutional:      Appearance: Normal appearance.  HENT:     Head: Normocephalic and atraumatic.     Nose: Nose normal.     Mouth/Throat:     Mouth: Mucous membranes are moist.  Eyes:     Extraocular Movements: Extraocular movements intact.     Conjunctiva/sclera: Conjunctivae normal.  Cardiovascular:     Rate and Rhythm: Normal rate.  Pulmonary:     Effort: Pulmonary effort is normal.     Breath sounds: Normal breath sounds.  Abdominal:     General: Abdomen is flat.     Palpations: Abdomen is soft.     Tenderness: There is no abdominal tenderness.  Musculoskeletal:        General: No swelling. Normal range of motion.     Cervical back: Neck supple.  Skin:    General: Skin is warm and dry.  Neurological:     General: No focal deficit present.     Mental Status: She is alert.  Psychiatric:        Mood and Affect: Mood normal.     ED Results / Procedures / Treatments   EKG None  Procedures Procedures  Medications Ordered in the ED Medications  ondansetron  (ZOFRAN ) injection 4 mg (4 mg Intravenous Given 05/28/24 0516)  lactated ringers  bolus 1,000 mL (1,000 mLs Intravenous New Bag/Given 05/28/24 0522)  Initial Impression and Plan  Patient here with several hours of vomiting. Not a common complaint of hers. Exam and vitals are reassuring. Will check labs, given antiemetics and IVF and reassess.   ED Course   Clinical Course as of 05/28/24 0607  Mon May 28, 2024  0540 HCG is neg.  [CS]  [phone] CMP with mild anion gap, likely from volume loss.  [CS]  0600 CBC with mild leukocytosis, otherwise unremarkable.  [CS]  0600 Covid/Flu/RSV swab ordered in triage is neg.  [CS]  9395 Patient feeling better, no further vomiting. She will attempt PO trial and plan discharge with Rx for Zofran . Continue oral rehydration, advance diet as tolerated. PCP follow up, RTED for any other concerns.  [CS]    Clinical Course User Index [CS] Roselyn Carlin NOVAK, MD     MDM  Rules/Calculators/A&P Medical Decision Making Given presenting complaint, I considered that admission might be necessary. After review of results from ED lab and/or imaging studies, admission to the hospital is not indicated at this time.    Problems Addressed: Nausea and vomiting, unspecified vomiting type: acute illness or injury  Amount and/or Complexity of Data Reviewed Labs: ordered. Decision-making details documented in ED Course.  Risk Prescription drug management. Decision regarding hospitalization.     Final Clinical Impression(s) / ED Diagnoses Final diagnoses:  Nausea and vomiting, unspecified vomiting type    Rx / DC Orders ED Discharge Orders          Ordered    ondansetron  (ZOFRAN -ODT) 4 MG disintegrating tablet  Every 8 hours PRN        05/28/24 0606             Roselyn Carlin NOVAK, MD 05/28/24 [phone]

## 2024-06-28 ENCOUNTER — Ambulatory Visit (HOSPITAL_COMMUNITY): Payer: Self-pay | Admitting: Licensed Clinical Social Worker

## 2024-06-28 DIAGNOSIS — Z008 Encounter for other general examination: Secondary | ICD-10-CM

## 2024-06-28 NOTE — Progress Notes (Signed)
  Virtual Visit via Video Note  I connected with Ellyssa Sharie Amorin on 06/28/24 at  1:00 PM EDT by a video enabled telemedicine application and verified that I am speaking with the correct person using two identifiers.  Location: Patient: [facility]  Provider: [facility]iders Home Office    I discussed the limitations of evaluation and management by telemedicine and the availability of in person appointments. The patient expressed understanding and agreed to proceed.    I discussed the assessment and treatment plan with the patient. The patient was provided an opportunity to ask questions and all were answered. The patient agreed with the plan and demonstrated an understanding of the instructions.   The patient was advised to call back or seek an in-person evaluation if the symptoms worsen or if the condition fails to improve as anticipated.  I provided 30 minutes of non-face-to-face time during this encounter.   Juliene GORMAN Patee, LCSW  Patient ID: Lastacia Solum, female   DOB: 1986/06/07, 38 y.o.   MRN: [id number]   Patient comes in today for her first appointment at 1 PM.  She was outside walking with headphones and for privacy purposes.  Patient self-reported that she was in the city of [address] but still in the [place] of [place].  She presented today with anxious mood\affect.  She was pleasant and cooperative during duration of comprehensive clinical assessment.  She was casually dressed and engaged well  Patient reports that she would like to get back on her medications.  She reports that she has not taken her medications in 1 year as the office that she was following up with in [place]  close down.  Patient states that for a period of time she moved to [place]  and is just now moving back.  Patient states that since the psychiatric office close down she has not had her medications.  Medications taken 1 year ago and include Remeron , Ambien, and a undisclosed  sleeping medication that she cannot recall the name of.  LCSW started session by completing a PHQ-9 and alcohol audit.  Total duration of session was 20 minutes after that time patient disconnected.  LCSW waited 5 minutes and then followed up with a phone call but it went straight to voicemail.  LCSW waited another 5 minutes and called again phone again went straight to voicemail.  LCSW left to have a compliant voicemails and waited until 135 before disconnecting.  LCSW provided an voicemail number to [facility] for rescheduling.

## 2024-10-15 ENCOUNTER — Ambulatory Visit (HOSPITAL_COMMUNITY)
Admission: EM | Admit: 2024-10-15 | Discharge: 2024-10-15 | Disposition: A | Discharge status: Home / Self Care | Payer: Self-pay | Attending: Family Medicine | Admitting: Family Medicine

## 2024-10-15 ENCOUNTER — Ambulatory Visit (INDEPENDENT_AMBULATORY_CARE_PROVIDER_SITE_OTHER): Payer: Self-pay

## 2024-10-15 ENCOUNTER — Encounter (HOSPITAL_COMMUNITY): Payer: Self-pay

## 2024-10-15 DIAGNOSIS — M79605 Pain in left leg: Secondary | ICD-10-CM

## 2024-10-15 MED ORDER — NAPROXEN 500 MG PO TABS
500.0000 mg | ORAL_TABLET | Freq: Two times a day (BID) | ORAL | 0 refills | Status: AC | PRN
Start: 2024-10-15 — End: ?

## 2024-10-15 NOTE — ED Triage Notes (Signed)
 Patient here today with c/o left leg pain that has been worsening over the past 2 weeks. Patient states that she had been having pain ever since breaking her leg in 8 different places and undergoing reconstructive surgery.

## 2024-10-15 NOTE — Discharge Instructions (Signed)
 The radiologist did not see any abnormalities on your x-rays.  Take naproxen  500 mg--1 tablet every 12 hours as needed for pain  Please follow-up with your primary care about this issue

## 2024-10-15 NOTE — ED Provider Notes (Signed)
 [facility]    CSN: [id number] Arrival date & time: 10/15/24  9082      History   Chief Complaint Chief Complaint  Patient presents with   Leg Pain    HPI Amber Bernard is a 38 y.o. female.    Leg Pain Here for left leg pain that has been bothering her in the last 2 weeks, mainly since it has been getting cooler.  No recent trauma, but she states she did break her left leg and 8 places when she was 15, about 23 years ago.  No fever or chills and she says someone thought her leg looks swollen 1 time.  She is allergic to no medications  She has been taking some naproxen  which helps some.  Last menstrual cycle was October 29.  Past Medical History:  Diagnosis Date   ADD (attention deficit disorder)    ADHD (attention deficit hyperactivity disorder)    Anxiety    Asthma    MDD (major depressive disorder)    PTSD (post-traumatic stress disorder)    Sickle cell trait     Patient Active Problem List   Diagnosis Date Noted   Major depressive disorder, recurrent episode, moderate (HCC) 09/08/2016   Abnormal urinalysis     Past Surgical History:  Procedure Laterality Date   APPENDECTOMY      OB History   No obstetric history on file.      Home Medications    Prior to Admission medications   Medication Sig Start Date End Date Taking? Authorizing Provider  naproxen  (NAPROSYN ) 500 MG tablet Take 1 tablet (500 mg total) by mouth 2 (two) times daily as needed (pain). 10/15/24  Yes Vonna Sharlet POUR, MD  azelastine  (ASTELIN ) 0.1 % nasal spray Place 2 sprays into both nostrils 2 (two) times daily. Use in each nostril as directed 05/12/24   Johnie Rumaldo LABOR, NP    Family History History reviewed. No pertinent family history.  Social History Social History   Tobacco Use   Smoking status: Every Day    Types: Cigarettes   Smokeless tobacco: Never  Vaping Use   Vaping status: Never Used  Substance Use Topics   Alcohol use: No   Drug  use: Yes    Types: Marijuana     Allergies   Pine needle [fir needle oil, canadian-balsam]; Aldona flavoring agent (non-screening); and Sausage [pickled meat]   Review of Systems Review of Systems   Physical Exam Triage Vital Signs ED Triage Vitals  Encounter Vitals Group     BP 10/15/24 1029 (!) 135/90     Girls Systolic BP Percentile --      Girls Diastolic BP Percentile --      Boys Systolic BP Percentile --      Boys Diastolic BP Percentile --      Pulse Rate 10/15/24 1029 60     Resp 10/15/24 1029 16     Temp 10/15/24 1029 98.3 F (36.8 C)     Temp Source 10/15/24 1029 Oral     SpO2 10/15/24 1029 96 %     Weight --      Height --      Head Circumference --      Peak Flow --      Pain Score 10/15/24 1025 10     Pain Loc --      Pain Education --      Exclude from Growth Chart --    No data found.  Updated  Vital Signs BP (!) 135/90 (BP Location: Left Arm)   Pulse 60   Temp 98.3 F (36.8 C) (Oral)   Resp 16   LMP 09/19/2024 (Approximate)   SpO2 96%   Visual Acuity Right Eye Distance:   Left Eye Distance:   Bilateral Distance:    Right Eye Near:   Left Eye Near:    Bilateral Near:     Physical Exam Vitals reviewed.  Constitutional:      General: She is not in acute distress.    Appearance: She is not ill-appearing, toxic-appearing or diaphoretic.  Musculoskeletal:     Right lower leg: No edema.     Left lower leg: No edema.     Comments: There is maybe some tenderness around her left greater trochanter.  The calf is soft and nontender and there is no erythema or edema of the upper or lower leg.  Pulses are normal.  There is no crepitus of the knee.    Skin:    Coloration: Skin is not jaundiced or pale.  Neurological:     General: No focal deficit present.     Mental Status: She is alert and oriented to person, place, and time.  Psychiatric:        Behavior: Behavior normal.      UC Treatments / Results  Labs (all labs ordered are  listed, but only abnormal results are displayed) Labs Reviewed - No data to display  EKG   Radiology DG Femur 1V Left Result Date: 10/15/2024 CLINICAL DATA:  Progressive left leg pain for 2 months. History of fracture with reconstructive surgery. EXAM: LEFT FEMUR 1 VIEW; LEFT TIBIA AND FIBULA - 2 VIEW COMPARISON:  Radiographs of the left knee and lower leg 06/08/2016. FINDINGS: Left femur: 2 AP views demonstrate no acute osseous abnormality. The mineralization and alignment are normal. The soft tissues appear unremarkable. Left tibia and fibula: The mineralization and alignment are normal. There is no evidence of acute fracture or dislocation. The joint spaces appear preserved at the left knee and ankle. No significant knee joint effusion or other focal soft tissue abnormality. IMPRESSION: No acute osseous findings or significant arthropathic changes in the left femur, tibia or fibula. Femoral assessment limited by single projection. Electronically Signed   By: Elsie Perone M.D.   On: 10/15/2024 11:39   DG Tibia/Fibula Left Result Date: 10/15/2024 CLINICAL DATA:  Progressive left leg pain for 2 months. History of fracture with reconstructive surgery. EXAM: LEFT FEMUR 1 VIEW; LEFT TIBIA AND FIBULA - 2 VIEW COMPARISON:  Radiographs of the left knee and lower leg 06/08/2016. FINDINGS: Left femur: 2 AP views demonstrate no acute osseous abnormality. The mineralization and alignment are normal. The soft tissues appear unremarkable. Left tibia and fibula: The mineralization and alignment are normal. There is no evidence of acute fracture or dislocation. The joint spaces appear preserved at the left knee and ankle. No significant knee joint effusion or other focal soft tissue abnormality. IMPRESSION: No acute osseous findings or significant arthropathic changes in the left femur, tibia or fibula. Femoral assessment limited by single projection. Electronically Signed   By: Elsie Perone M.D.   On:  10/15/2024 11:39    Procedures Procedures (including critical care time)  Medications Ordered in UC Medications - No data to display  Initial Impression / Assessment and Plan / UC Course  I have reviewed the triage vital signs and the nursing notes.  Pertinent labs & imaging results that were available during my care  of the patient were reviewed by me and considered in my medical decision making (see chart for details).     X-rays are read as negative for any acute abnormality.  There are no actually chronic osseous abnormalities either  500 mg naproxen  is sent to the pharmacy for her.  I have asked her to follow-up with her primary care.  She states she does have primary care.  Work note is provided Final Clinical Impressions(s) / UC Diagnoses   Final diagnoses:  Left leg pain     Discharge Instructions      The radiologist did not see any abnormalities on your x-rays.  Take naproxen  500 mg--1 tablet every 12 hours as needed for pain  Please follow-up with your primary care about this issue     ED Prescriptions     Medication Sig Dispense Auth. Provider   naproxen  (NAPROSYN ) 500 MG tablet Take 1 tablet (500 mg total) by mouth 2 (two) times daily as needed (pain). 30 tablet Kiandra Sanguinetti, Sharlet POUR, MD      I have reviewed the PDMP during this encounter.   Vonna Sharlet POUR, MD 10/15/24 [phone]

## 2024-12-13 ENCOUNTER — Emergency Department (HOSPITAL_BASED_OUTPATIENT_CLINIC_OR_DEPARTMENT_OTHER): Payer: Self-pay

## 2024-12-13 ENCOUNTER — Encounter (HOSPITAL_BASED_OUTPATIENT_CLINIC_OR_DEPARTMENT_OTHER): Payer: Self-pay

## 2024-12-13 ENCOUNTER — Emergency Department (HOSPITAL_BASED_OUTPATIENT_CLINIC_OR_DEPARTMENT_OTHER)
Admission: EM | Admit: 2024-12-13 | Discharge: 2024-12-13 | Disposition: A | Discharge status: Home / Self Care | Payer: Self-pay | Attending: Emergency Medicine | Admitting: Emergency Medicine

## 2024-12-13 ENCOUNTER — Other Ambulatory Visit: Payer: Self-pay

## 2024-12-13 DIAGNOSIS — D72829 Elevated white blood cell count, unspecified: Secondary | ICD-10-CM | POA: Insufficient documentation

## 2024-12-13 DIAGNOSIS — R109 Unspecified abdominal pain: Secondary | ICD-10-CM

## 2024-12-13 DIAGNOSIS — K529 Noninfective gastroenteritis and colitis, unspecified: Secondary | ICD-10-CM | POA: Insufficient documentation

## 2024-12-13 LAB — COMPREHENSIVE METABOLIC PANEL WITH GFR
ALT: 15 U/L (ref 0–44)
AST: 21 U/L (ref 15–41)
Albumin: 4.7 g/dL (ref 3.5–5.0)
Alkaline Phosphatase: 47 U/L (ref 38–126)
Anion gap: 13 (ref 5–15)
BUN: 19 mg/dL (ref 6–20)
CO2: 23 mmol/L (ref 22–32)
Calcium: 9.3 mg/dL (ref 8.9–10.3)
Chloride: 106 mmol/L (ref 98–111)
Creatinine, Ser: 0.75 mg/dL (ref 0.44–1.00)
GFR, Estimated: >60 mL/min
Glucose, Bld: 105 mg/dL — ABNORMAL HIGH (ref 70–99)
Potassium: 3.5 mmol/L (ref 3.5–5.1)
Sodium: 141 mmol/L (ref 135–145)
Total Bilirubin: 0.4 mg/dL (ref 0.0–1.2)
Total Protein: 7.6 g/dL (ref 6.5–8.1)

## 2024-12-13 LAB — LIPASE, BLOOD: Lipase: 16 U/L (ref 11–51)

## 2024-12-13 LAB — CBC WITH DIFFERENTIAL/PLATELET
Abs Immature Granulocytes: 0.06 K/uL (ref 0.00–0.07)
Basophils Absolute: 0 K/uL (ref 0.0–0.1)
Basophils Relative: 0 %
Eosinophils Absolute: 0 K/uL (ref 0.0–0.5)
Eosinophils Relative: 0 %
HCT: 36.7 % (ref 36.0–46.0)
Hemoglobin: 12.1 g/dL (ref 12.0–15.0)
Immature Granulocytes: 0 %
Lymphocytes Relative: 6 %
Lymphs Abs: 1.1 K/uL (ref 0.7–4.0)
MCH: 28.1 pg (ref 26.0–34.0)
MCHC: 33 g/dL (ref 30.0–36.0)
MCV: 85.3 fL (ref 80.0–100.0)
Monocytes Absolute: 1.3 K/uL — ABNORMAL HIGH (ref 0.1–1.0)
Monocytes Relative: 7 %
Neutro Abs: 15.8 K/uL — ABNORMAL HIGH (ref 1.7–7.7)
Neutrophils Relative %: 87 %
Platelets: 289 K/uL (ref 150–400)
RBC: 4.3 MIL/uL (ref 3.87–5.11)
RDW: 15.8 % — ABNORMAL HIGH (ref 11.5–15.5)
WBC: 18.3 K/uL — ABNORMAL HIGH (ref 4.0–10.5)
nRBC: 0 % (ref 0.0–0.2)

## 2024-12-13 LAB — HCG, SERUM, QUALITATIVE: Preg, Serum: NEGATIVE

## 2024-12-13 LAB — RESP PANEL BY RT-PCR (RSV, FLU A&B, COVID)  RVPGX2
Influenza A by PCR: NEGATIVE
Influenza B by PCR: NEGATIVE
Resp Syncytial Virus by PCR: NEGATIVE
SARS Coronavirus 2 by RT PCR: NEGATIVE

## 2024-12-13 MED ORDER — MORPHINE SULFATE (PF) 4 MG/ML IV SOLN
4.0000 mg | Freq: Once | INTRAVENOUS | Status: AC
  Administered 2024-12-13: 4 mg via INTRAVENOUS
  Filled 2024-12-13: qty 1

## 2024-12-13 MED ORDER — SODIUM CHLORIDE 0.9 % IV BOLUS
1000.0000 mL | Freq: Once | INTRAVENOUS | Status: AC
  Administered 2024-12-13: 1000 mL via INTRAVENOUS

## 2024-12-13 MED ORDER — KETOROLAC TROMETHAMINE 30 MG/ML IJ SOLN
30.0000 mg | Freq: Once | INTRAMUSCULAR | Status: AC
  Administered 2024-12-13: 30 mg via INTRAVENOUS
  Filled 2024-12-13: qty 1

## 2024-12-13 MED ORDER — ONDANSETRON 8 MG PO TBDP: ORAL_TABLET | ORAL | 0 refills | Status: AC

## 2024-12-13 MED ORDER — ONDANSETRON HCL 4 MG/2ML IJ SOLN
4.0000 mg | Freq: Once | INTRAMUSCULAR | Status: AC
  Administered 2024-12-13: 4 mg via INTRAVENOUS
  Filled 2024-12-13: qty 2

## 2024-12-13 MED ORDER — IOHEXOL 300 MG/ML  SOLN
100.0000 mL | Freq: Once | INTRAMUSCULAR | Status: AC | PRN
  Administered 2024-12-13: 100 mL via INTRAVENOUS

## 2024-12-13 NOTE — ED Triage Notes (Signed)
 Vomiting & diarrhea started 2 hrs ago.  Abdominal pain

## 2024-12-13 NOTE — Discharge Instructions (Signed)
Begin taking Zofran as prescribed as needed for nausea.  Clear liquids for the next 12 hours, then slowly advance diet as tolerated.  Return to the emergency department if you develop severe abdominal pain, high fevers, bloody stools, or for other new and concerning symptoms. 

## 2024-12-13 NOTE — ED Notes (Signed)
"  Patient provided water for PO challenge   "

## 2024-12-13 NOTE — ED Notes (Signed)
 Patient tolerated her PO challenge with no vomiting or nausea.

## 2024-12-13 NOTE — ED Provider Notes (Signed)
 " [place] [facility] Provider Note   CSN: [id number] Arrival date & time: 12/13/24  0255     Patient presents with: Emesis   Amber Bernard is a 39 y.o. female.   Patient is a 39 year old female with history of depression.  Patient presenting today with complaints of nausea, vomiting, and diarrhea.  Symptoms started suddenly approximately 3 hours prior to arrival.  She describes multiple episodes of nausea and vomiting along with nonbloody diarrhea.  No fevers or chills.  She denies any ill contacts or having consumed any undercooked or suspicious foods.  No aggravating or alleviating factors.       Prior to Admission medications  Medication Sig Start Date End Date Taking? Authorizing Provider  azelastine  (ASTELIN ) 0.1 % nasal spray Place 2 sprays into both nostrils 2 (two) times daily. Use in each nostril as directed 05/12/24   Johnie Flaming A, NP  naproxen  (NAPROSYN ) 500 MG tablet Take 1 tablet (500 mg total) by mouth 2 (two) times daily as needed (pain). 10/15/24   Vonna Sharlet POUR, MD    Allergies: Pine needle [fir needle oil, canadian-balsam]; Bacon flavoring agent (non-screening); and Sausage [pickled meat]    Review of Systems  All other systems reviewed and are negative.   Updated Vital Signs BP (!) 162/102   Pulse (!) 53   Temp 98.3 F (36.8 C) (Oral)   Resp 18   Ht 5' 5 (1.651 m)   Wt 59 kg   LMP 12/06/2024 (Exact Date)   SpO2 100%   BMI 21.63 kg/m   Physical Exam Vitals and nursing note reviewed.  Constitutional:      General: She is not in acute distress.    Appearance: She is well-developed. She is not diaphoretic.  HENT:     Head: Normocephalic and atraumatic.  Cardiovascular:     Rate and Rhythm: Normal rate and regular rhythm.     Heart sounds: No murmur heard.    No friction rub. No gallop.  Pulmonary:     Effort: Pulmonary effort is normal. No respiratory distress.     Breath sounds: Normal  breath sounds. No wheezing.  Abdominal:     General: Bowel sounds are normal. There is no distension.     Palpations: Abdomen is soft.     Tenderness: There is abdominal tenderness. There is no guarding or rebound.     Comments: There is generalized abdominal tenderness.  Musculoskeletal:        General: Normal range of motion.     Cervical back: Normal range of motion and neck supple.  Skin:    General: Skin is warm and dry.  Neurological:     General: No focal deficit present.     Mental Status: She is alert and oriented to person, place, and time.     (all labs ordered are listed, but only abnormal results are displayed) Labs Reviewed  CBC WITH DIFFERENTIAL/PLATELET - Abnormal; Notable for the following components:      Result Value   WBC 18.3 (*)    RDW 15.8 (*)    Neutro Abs 15.8 (*)    Monocytes Absolute 1.3 (*)    All other components within normal limits  RESP PANEL BY RT-PCR (RSV, FLU A&B, COVID)  [id number]  COMPREHENSIVE METABOLIC PANEL WITH GFR  URINALYSIS, ROUTINE W REFLEX MICROSCOPIC  LIPASE, BLOOD  HCG, SERUM, QUALITATIVE    EKG: None  Radiology: No results found.   Procedures  Medications Ordered in the ED  sodium chloride  0.9 % bolus 1,000 mL (has no administration in time range)  ondansetron  (ZOFRAN ) injection 4 mg (has no administration in time range)  ketorolac  (TORADOL ) 30 MG/ML injection 30 mg (has no administration in time range)  morphine  (PF) 4 MG/ML injection 4 mg (has no administration in time range)                                    Medical Decision Making Amount and/or Complexity of Data Reviewed Labs: ordered. Radiology: ordered.  Risk Prescription drug management.   Patient is a 39 year old female presenting with nausea, vomiting, and diarrhea as described in the HPI.  Patient arrives here with stable vital signs and is afebrile.  She does have tenderness across the upper abdomen, but no peritoneal signs.  Laboratory studies  obtained including CBC, CMP, and lipase.  White count is 18,000, but laboratory studies otherwise unremarkable.  Pregnancy test is negative.  COVID/flu/RSV all negative.  CT scan of the abdomen and pelvis obtained showing a 7 cm right ovarian cyst, but no other acute inflammatory process in the abdomen or pelvis.  Patient given IV fluids along with Toradol  and morphine  for pain and Zofran  for nausea and seems to be feeling better.  She was given a p.o. challenge with good results.  I suspect the cause of her symptoms is likely a viral gastroenteritis or possibly a foodborne illness.  Either way, I believe this illness to be self-limited.  She will be discharged with Zofran , clear liquids, and as needed return.     Final diagnoses:  None    ED Discharge Orders     None          Geroldine Berg, MD 12/13/24 [phone]  "
